# Patient Record
Sex: Male | Born: 1940 | Race: Black or African American | Hispanic: No | Smoking: Former smoker
Health system: Southern US, Community
[De-identification: ages and names within clinical notes are randomized; demographics above are authoritative.]

## PROBLEM LIST (undated history)

## (undated) VITALS — BP 138/62 | HR 84 | Temp 98.7°F | Resp 17 | Ht 72.99 in | Wt 148.5 lb

## (undated) DIAGNOSIS — L8994 Pressure ulcer of unspecified site, stage 4: Secondary | ICD-10-CM

## (undated) DIAGNOSIS — I10 Essential (primary) hypertension: Secondary | ICD-10-CM

## (undated) DIAGNOSIS — L089 Local infection of the skin and subcutaneous tissue, unspecified: Secondary | ICD-10-CM

## (undated) DIAGNOSIS — O2230 Deep phlebothrombosis in pregnancy, unspecified trimester: Secondary | ICD-10-CM

## (undated) DIAGNOSIS — N400 Enlarged prostate without lower urinary tract symptoms: Secondary | ICD-10-CM

## (undated) DIAGNOSIS — G8250 Quadriplegia, unspecified: Secondary | ICD-10-CM

## (undated) DIAGNOSIS — I639 Cerebral infarction, unspecified: Secondary | ICD-10-CM

## (undated) DIAGNOSIS — D649 Anemia, unspecified: Secondary | ICD-10-CM

## (undated) DIAGNOSIS — J690 Pneumonitis due to inhalation of food and vomit: Secondary | ICD-10-CM

## (undated) DIAGNOSIS — E119 Type 2 diabetes mellitus without complications: Secondary | ICD-10-CM

## (undated) HISTORY — PX: COLON SURGERY: SHX602

## (undated) HISTORY — PX: COLOSTOMY: SHX63

## (undated) HISTORY — PX: SUPRAPUBIC CATHETER INSERTION: SUR719

## (undated) HISTORY — PX: PEG PLACEMENT: SHX5437

---

## 2014-08-20 ENCOUNTER — Inpatient Hospital Stay
Admit: 2014-08-20 | Discharge: 2014-09-13 | DRG: 853 | Disposition: A | Discharge status: Skilled Nursing Facility | Payer: Medicare PPO | Source: Ambulatory Visit | Attending: Internal Medicine | Admitting: Internal Medicine

## 2014-08-20 DIAGNOSIS — D62 Acute posthemorrhagic anemia: Secondary | ICD-10-CM | POA: Diagnosis not present

## 2014-08-20 DIAGNOSIS — M62838 Other muscle spasm: Secondary | ICD-10-CM | POA: Diagnosis not present

## 2014-08-20 DIAGNOSIS — L89154 Pressure ulcer of sacral region, stage 4: Secondary | ICD-10-CM | POA: Diagnosis present

## 2014-08-20 DIAGNOSIS — K2960 Other gastritis without bleeding: Secondary | ICD-10-CM | POA: Diagnosis present

## 2014-08-20 DIAGNOSIS — N3000 Acute cystitis without hematuria: Secondary | ICD-10-CM | POA: Diagnosis present

## 2014-08-20 DIAGNOSIS — N390 Urinary tract infection, site not specified: Secondary | ICD-10-CM

## 2014-08-20 DIAGNOSIS — I82A12 Acute embolism and thrombosis of left axillary vein: Secondary | ICD-10-CM | POA: Diagnosis present

## 2014-08-20 DIAGNOSIS — K2980 Duodenitis without bleeding: Secondary | ICD-10-CM | POA: Diagnosis present

## 2014-08-20 DIAGNOSIS — K5900 Constipation, unspecified: Secondary | ICD-10-CM | POA: Diagnosis present

## 2014-08-20 DIAGNOSIS — R1314 Dysphagia, pharyngoesophageal phase: Secondary | ICD-10-CM | POA: Diagnosis present

## 2014-08-20 DIAGNOSIS — B961 Klebsiella pneumoniae [K. pneumoniae] as the cause of diseases classified elsewhere: Secondary | ICD-10-CM | POA: Diagnosis present

## 2014-08-20 DIAGNOSIS — R633 Feeding difficulties: Secondary | ICD-10-CM | POA: Diagnosis present

## 2014-08-20 DIAGNOSIS — D649 Anemia, unspecified: Secondary | ICD-10-CM | POA: Diagnosis present

## 2014-08-20 DIAGNOSIS — J690 Pneumonitis due to inhalation of food and vomit: Secondary | ICD-10-CM | POA: Diagnosis present

## 2014-08-20 DIAGNOSIS — L089 Local infection of the skin and subcutaneous tissue, unspecified: Secondary | ICD-10-CM

## 2014-08-20 DIAGNOSIS — R6520 Severe sepsis without septic shock: Secondary | ICD-10-CM | POA: Diagnosis present

## 2014-08-20 DIAGNOSIS — T8351XA Infection and inflammatory reaction due to indwelling urinary catheter, initial encounter: Secondary | ICD-10-CM | POA: Diagnosis present

## 2014-08-20 DIAGNOSIS — G253 Myoclonus: Secondary | ICD-10-CM | POA: Diagnosis present

## 2014-08-20 DIAGNOSIS — G9341 Metabolic encephalopathy: Secondary | ICD-10-CM | POA: Diagnosis present

## 2014-08-20 DIAGNOSIS — A419 Sepsis, unspecified organism: Secondary | ICD-10-CM

## 2014-08-20 DIAGNOSIS — L89159 Pressure ulcer of sacral region, unspecified stage: Secondary | ICD-10-CM | POA: Diagnosis not present

## 2014-08-20 DIAGNOSIS — E43 Unspecified severe protein-calorie malnutrition: Secondary | ICD-10-CM | POA: Diagnosis present

## 2014-08-20 DIAGNOSIS — N179 Acute kidney failure, unspecified: Secondary | ICD-10-CM

## 2014-08-20 DIAGNOSIS — E870 Hyperosmolality and hypernatremia: Secondary | ICD-10-CM | POA: Diagnosis present

## 2014-08-20 DIAGNOSIS — Z933 Colostomy status: Secondary | ICD-10-CM

## 2014-08-20 DIAGNOSIS — Z452 Encounter for adjustment and management of vascular access device: Secondary | ICD-10-CM

## 2014-08-20 DIAGNOSIS — Z79899 Other long term (current) drug therapy: Secondary | ICD-10-CM

## 2014-08-20 DIAGNOSIS — A4189 Other specified sepsis: Secondary | ICD-10-CM | POA: Diagnosis present

## 2014-08-20 DIAGNOSIS — F329 Major depressive disorder, single episode, unspecified: Secondary | ICD-10-CM | POA: Diagnosis present

## 2014-08-20 DIAGNOSIS — M7989 Other specified soft tissue disorders: Secondary | ICD-10-CM

## 2014-08-20 DIAGNOSIS — Z681 Body mass index (BMI) 19 or less, adult: Secondary | ICD-10-CM

## 2014-08-20 DIAGNOSIS — G8250 Quadriplegia, unspecified: Secondary | ICD-10-CM | POA: Diagnosis present

## 2014-08-20 DIAGNOSIS — E119 Type 2 diabetes mellitus without complications: Secondary | ICD-10-CM | POA: Diagnosis present

## 2014-08-20 DIAGNOSIS — R4182 Altered mental status, unspecified: Secondary | ICD-10-CM | POA: Insufficient documentation

## 2014-08-20 DIAGNOSIS — N170 Acute kidney failure with tubular necrosis: Secondary | ICD-10-CM | POA: Diagnosis not present

## 2014-08-20 DIAGNOSIS — I10 Essential (primary) hypertension: Secondary | ICD-10-CM | POA: Diagnosis present

## 2014-08-20 DIAGNOSIS — L8990 Pressure ulcer of unspecified site, unspecified stage: Secondary | ICD-10-CM

## 2014-08-20 DIAGNOSIS — R609 Edema, unspecified: Secondary | ICD-10-CM

## 2014-08-20 DIAGNOSIS — R627 Adult failure to thrive: Secondary | ICD-10-CM | POA: Diagnosis not present

## 2014-08-20 DIAGNOSIS — B957 Other staphylococcus as the cause of diseases classified elsewhere: Secondary | ICD-10-CM | POA: Diagnosis present

## 2014-08-20 DIAGNOSIS — R531 Weakness: Secondary | ICD-10-CM

## 2014-08-20 DIAGNOSIS — Z9359 Other cystostomy status: Secondary | ICD-10-CM

## 2014-08-20 LAB — TROPONIN I: Troponin-I: 0.03 ng/mL

## 2014-08-20 LAB — COMPREHENSIVE METABOLIC PANEL
ALBUMIN: 2 g/dL — AB
ALK PHOS: 129 U/L — AB
ALT: 19 U/L
ANION GAP: 11 (ref 7–16)
BUN: 53 mg/dL — ABNORMAL HIGH
Bilirubin,Total: 0.7 mg/dL
CALCIUM: 8.5 mg/dL — AB
CREATININE: 2.25 mg/dL — AB
Chloride: 104 mmol/L
Co2: 28 mmol/L
EGFR (Non-African Amer.): 27 — ABNORMAL LOW
GFR CALC AF AMER: 32 — AB
Glucose: 157 mg/dL — ABNORMAL HIGH
Potassium: 4.1 mmol/L
SGOT(AST): 41 U/L
Sodium: 143 mmol/L
Total Protein: 6.3 g/dL — ABNORMAL LOW

## 2014-08-20 LAB — URINALYSIS, COMPLETE
Bilirubin,UR: NEGATIVE
Glucose,UR: NEGATIVE mg/dL (ref 0–75)
Ketone: NEGATIVE
NITRITE: NEGATIVE
PROTEIN: NEGATIVE
Ph: 5 (ref 4.5–8.0)
Specific Gravity: 1.016 (ref 1.003–1.030)
Squamous Epithelial: NONE SEEN

## 2014-08-20 LAB — CBC
HCT: 24.3 % — AB (ref 40.0–52.0)
HGB: 7.7 g/dL — ABNORMAL LOW (ref 13.0–18.0)
MCH: 29.1 pg (ref 26.0–34.0)
MCHC: 31.8 g/dL — AB (ref 32.0–36.0)
MCV: 91 fL (ref 80–100)
PLATELETS: 287 10*3/uL (ref 150–440)
RBC: 2.66 10*6/uL — ABNORMAL LOW (ref 4.40–5.90)
RDW: 15.6 % — ABNORMAL HIGH (ref 11.5–14.5)
WBC: 6.9 10*3/uL (ref 3.8–10.6)

## 2014-08-20 LAB — LACTIC ACID, PLASMA: Lactic Acid, Venous: 2 mmol/L

## 2014-08-21 LAB — CBC WITH DIFFERENTIAL/PLATELET
Basophil #: 0 10*3/uL (ref 0.0–0.1)
Basophil %: 0.2 %
EOS PCT: 0.7 %
Eosinophil #: 0 10*3/uL (ref 0.0–0.7)
HCT: 20.6 % — AB (ref 40.0–52.0)
HGB: 6.5 g/dL — ABNORMAL LOW (ref 13.0–18.0)
LYMPHS PCT: 10.2 %
Lymphocyte #: 0.6 10*3/uL — ABNORMAL LOW (ref 1.0–3.6)
MCH: 28.7 pg (ref 26.0–34.0)
MCHC: 31.6 g/dL — AB (ref 32.0–36.0)
MCV: 91 fL (ref 80–100)
MONO ABS: 0.6 x10 3/mm (ref 0.2–1.0)
Monocyte %: 10.2 %
NEUTROS PCT: 78.7 %
Neutrophil #: 4.3 10*3/uL (ref 1.4–6.5)
PLATELETS: 227 10*3/uL (ref 150–440)
RBC: 2.27 10*6/uL — ABNORMAL LOW (ref 4.40–5.90)
RDW: 15.5 % — AB (ref 11.5–14.5)
WBC: 5.4 10*3/uL (ref 3.8–10.6)

## 2014-08-21 LAB — BASIC METABOLIC PANEL
Anion Gap: 6 — ABNORMAL LOW (ref 7–16)
BUN: 45 mg/dL — ABNORMAL HIGH
CALCIUM: 7.7 mg/dL — AB
CREATININE: 1.58 mg/dL — AB
Chloride: 113 mmol/L — ABNORMAL HIGH
Co2: 26 mmol/L
EGFR (African American): 49 — ABNORMAL LOW
EGFR (Non-African Amer.): 42 — ABNORMAL LOW
Glucose: 86 mg/dL
POTASSIUM: 3.8 mmol/L
Sodium: 145 mmol/L

## 2014-08-21 LAB — OCCULT BLOOD X 1 CARD TO LAB, STOOL: Occult Blood, Feces: POSITIVE

## 2014-08-22 LAB — BASIC METABOLIC PANEL
ANION GAP: 9 (ref 7–16)
BUN: 33 mg/dL — ABNORMAL HIGH
Calcium, Total: 7.8 mg/dL — ABNORMAL LOW
Chloride: 113 mmol/L — ABNORMAL HIGH
Co2: 25 mmol/L
Creatinine: 1.19 mg/dL
EGFR (African American): >60
EGFR (Non-African Amer.): 59 — ABNORMAL LOW
Glucose: 91 mg/dL
Potassium: 4.1 mmol/L
Sodium: 147 mmol/L — ABNORMAL HIGH

## 2014-08-22 LAB — CBC WITH DIFFERENTIAL/PLATELET
Basophil #: 0 10*3/uL
Basophil %: 0.4 %
Eosinophil #: 0.1 10*3/uL
Eosinophil %: 3 %
HCT: 25.3 % — ABNORMAL LOW
HGB: 7.9 g/dL — ABNORMAL LOW
Lymphocyte %: 16.8 %
Lymphs Abs: 0.8 10*3/uL — ABNORMAL LOW
MCH: 27.9 pg
MCHC: 31.2 g/dL — ABNORMAL LOW
MCV: 90 fL
Monocyte #: 0.4 10*3/uL
Monocyte %: 9.4 %
Neutrophil #: 3.2 10*3/uL
Neutrophil %: 70.4 %
Platelet: 250 10*3/uL
RBC: 2.82 x10 6/mm 3 — ABNORMAL LOW
RDW: 16.2 % — ABNORMAL HIGH
WBC: 4.6 10*3/uL

## 2014-08-23 LAB — BASIC METABOLIC PANEL
ANION GAP: 8 (ref 7–16)
BUN: 28 mg/dL — AB
Calcium, Total: 7.9 mg/dL — ABNORMAL LOW
Chloride: 115 mmol/L — ABNORMAL HIGH
Co2: 23 mmol/L
Creatinine: 1.03 mg/dL
EGFR (African American): >60
Glucose: 203 mg/dL — ABNORMAL HIGH
Potassium: 3.9 mmol/L
Sodium: 146 mmol/L — ABNORMAL HIGH

## 2014-08-23 LAB — HEMOGLOBIN A1C: HEMOGLOBIN A1C: 7.5 % — AB

## 2014-08-23 LAB — URINE CULTURE

## 2014-08-23 LAB — HEMOGLOBIN: HGB: 7.8 g/dL — ABNORMAL LOW (ref 13.0–18.0)

## 2014-08-23 LAB — VANCOMYCIN, TROUGH: Vancomycin, Trough: 16 ug/mL

## 2014-08-24 LAB — CBC WITH DIFFERENTIAL/PLATELET
Basophil #: 0 10*3/uL (ref 0.0–0.1)
Basophil %: 0.3 %
EOS PCT: 1 %
Eosinophil #: 0.1 10*3/uL (ref 0.0–0.7)
HCT: 24.3 % — AB (ref 40.0–52.0)
HGB: 7.6 g/dL — AB (ref 13.0–18.0)
LYMPHS ABS: 0.9 10*3/uL — AB (ref 1.0–3.6)
Lymphocyte %: 17.6 %
MCH: 28 pg (ref 26.0–34.0)
MCHC: 31.1 g/dL — ABNORMAL LOW (ref 32.0–36.0)
MCV: 90 fL (ref 80–100)
MONOS PCT: 10.5 %
Monocyte #: 0.5 x10 3/mm (ref 0.2–1.0)
NEUTROS ABS: 3.7 10*3/uL (ref 1.4–6.5)
Neutrophil %: 70.6 %
Platelet: 266 10*3/uL (ref 150–440)
RBC: 2.7 10*6/uL — AB (ref 4.40–5.90)
RDW: 16.4 % — ABNORMAL HIGH (ref 11.5–14.5)
WBC: 5.2 10*3/uL (ref 3.8–10.6)

## 2014-08-24 LAB — BASIC METABOLIC PANEL
ANION GAP: 6 — AB (ref 7–16)
BUN: 24 mg/dL — AB
CALCIUM: 7.9 mg/dL — AB
Chloride: 113 mmol/L — ABNORMAL HIGH
Co2: 25 mmol/L
Creatinine: 1.14 mg/dL
EGFR (African American): >60
EGFR (Non-African Amer.): >60
GLUCOSE: 156 mg/dL — AB
Potassium: 3.8 mmol/L
Sodium: 144 mmol/L

## 2014-08-25 LAB — CBC WITH DIFFERENTIAL/PLATELET
BASOS ABS: 0 10*3/uL (ref 0.0–0.1)
Basophil %: 0.3 %
Eosinophil #: 0.1 10*3/uL (ref 0.0–0.7)
Eosinophil %: 2 %
HCT: 24.3 % — AB (ref 40.0–52.0)
HGB: 7.6 g/dL — ABNORMAL LOW (ref 13.0–18.0)
LYMPHS ABS: 0.7 10*3/uL — AB (ref 1.0–3.6)
Lymphocyte %: 11.6 %
MCH: 28.2 pg (ref 26.0–34.0)
MCHC: 31.2 g/dL — ABNORMAL LOW (ref 32.0–36.0)
MCV: 90 fL (ref 80–100)
MONO ABS: 0.5 x10 3/mm (ref 0.2–1.0)
Monocyte %: 8.3 %
NEUTROS ABS: 4.6 10*3/uL (ref 1.4–6.5)
Neutrophil %: 77.8 %
Platelet: 283 10*3/uL (ref 150–440)
RBC: 2.69 10*6/uL — ABNORMAL LOW (ref 4.40–5.90)
RDW: 16.2 % — ABNORMAL HIGH (ref 11.5–14.5)
WBC: 5.9 10*3/uL (ref 3.8–10.6)

## 2014-08-25 LAB — BASIC METABOLIC PANEL
Anion Gap: 9 (ref 7–16)
BUN: 27 mg/dL — AB
CHLORIDE: 111 mmol/L
CREATININE: 1.18 mg/dL
Calcium, Total: 7.8 mg/dL — ABNORMAL LOW
Co2: 24 mmol/L
EGFR (Non-African Amer.): 60 — ABNORMAL LOW
Glucose: 145 mg/dL — ABNORMAL HIGH
Potassium: 4.2 mmol/L
Sodium: 144 mmol/L

## 2014-08-25 LAB — CULTURE, BLOOD (SINGLE)

## 2014-08-25 LAB — WOUND CULTURE

## 2014-08-26 LAB — BASIC METABOLIC PANEL
ANION GAP: 7 (ref 7–16)
BUN: 23 mg/dL — AB
CREATININE: 0.98 mg/dL
Calcium, Total: 7.6 mg/dL — ABNORMAL LOW
Chloride: 114 mmol/L — ABNORMAL HIGH
Co2: 26 mmol/L
EGFR (Non-African Amer.): >60
Glucose: 153 mg/dL — ABNORMAL HIGH
POTASSIUM: 4 mmol/L
Sodium: 147 mmol/L — ABNORMAL HIGH

## 2014-08-26 LAB — CBC WITH DIFFERENTIAL/PLATELET
BASOS PCT: 0.4 %
Basophil #: 0 10*3/uL (ref 0.0–0.1)
EOS ABS: 0.1 10*3/uL (ref 0.0–0.7)
EOS PCT: 2 %
HCT: 21.8 % — ABNORMAL LOW (ref 40.0–52.0)
HGB: 6.7 g/dL — AB (ref 13.0–18.0)
Lymphocyte #: 0.7 10*3/uL — ABNORMAL LOW (ref 1.0–3.6)
Lymphocyte %: 15.2 %
MCH: 28 pg (ref 26.0–34.0)
MCHC: 30.8 g/dL — ABNORMAL LOW (ref 32.0–36.0)
MCV: 91 fL (ref 80–100)
Monocyte #: 0.4 x10 3/mm (ref 0.2–1.0)
Monocyte %: 9.2 %
NEUTROS PCT: 73.2 %
Neutrophil #: 3.2 10*3/uL (ref 1.4–6.5)
Platelet: 231 10*3/uL (ref 150–440)
RBC: 2.4 10*6/uL — AB (ref 4.40–5.90)
RDW: 16.7 % — ABNORMAL HIGH (ref 11.5–14.5)
WBC: 4.4 10*3/uL (ref 3.8–10.6)

## 2014-08-26 LAB — HEMOGLOBIN: HGB: 8.3 g/dL — ABNORMAL LOW (ref 13.0–18.0)

## 2014-08-26 LAB — HEMATOCRIT: HCT: 26.1 % — AB (ref 40.0–52.0)

## 2014-08-26 LAB — SEDIMENTATION RATE: Erythrocyte Sed Rate: 91 mm/hr — ABNORMAL HIGH (ref 0–20)

## 2014-08-27 DIAGNOSIS — G8250 Quadriplegia, unspecified: Secondary | ICD-10-CM | POA: Diagnosis present

## 2014-08-27 LAB — BASIC METABOLIC PANEL
Anion Gap: 5 — ABNORMAL LOW (ref 7–16)
BUN: 18 mg/dL
CHLORIDE: 115 mmol/L — AB
CO2: 26 mmol/L
CREATININE: 0.82 mg/dL
Calcium, Total: 7.6 mg/dL — ABNORMAL LOW
EGFR (African American): >60
EGFR (Non-African Amer.): >60
GLUCOSE: 179 mg/dL — AB
Potassium: 3.8 mmol/L
Sodium: 146 mmol/L — ABNORMAL HIGH

## 2014-08-27 LAB — CBC WITH DIFFERENTIAL/PLATELET
Basophil #: 0 10*3/uL (ref 0.0–0.1)
Basophil %: 0.4 %
Eosinophil #: 0.1 10*3/uL (ref 0.0–0.7)
Eosinophil %: 1.4 %
HCT: 23.6 % — ABNORMAL LOW (ref 40.0–52.0)
HGB: 7.7 g/dL — ABNORMAL LOW (ref 13.0–18.0)
Lymphocyte #: 0.9 10*3/uL — ABNORMAL LOW (ref 1.0–3.6)
Lymphocyte %: 15.4 %
MCH: 29.4 pg (ref 26.0–34.0)
MCHC: 32.6 g/dL (ref 32.0–36.0)
MCV: 90 fL (ref 80–100)
MONOS PCT: 7.9 %
Monocyte #: 0.5 x10 3/mm (ref 0.2–1.0)
NEUTROS PCT: 74.9 %
Neutrophil #: 4.6 10*3/uL (ref 1.4–6.5)
PLATELETS: 231 10*3/uL (ref 150–440)
RBC: 2.61 10*6/uL — AB (ref 4.40–5.90)
RDW: 16.6 % — ABNORMAL HIGH (ref 11.5–14.5)
WBC: 6.1 10*3/uL (ref 3.8–10.6)

## 2014-08-27 MED ORDER — HEPARIN SODIUM (PORCINE) 5000 UNIT/ML IJ SOLN
5000.0000 [IU] | Freq: Three times a day (TID) | INTRAMUSCULAR | Status: DC
  Administered 2014-08-28 – 2014-09-06 (×22): 5000 [IU] via SUBCUTANEOUS
  Filled 2014-08-27 (×25): qty 1

## 2014-08-27 MED ORDER — KCL IN DEXTROSE-NACL 20-5-0.45 MEQ/L-%-% IV SOLN
INTRAVENOUS | Status: DC
  Administered 2014-08-28 – 2014-08-29 (×5): via INTRAVENOUS
  Filled 2014-08-27 (×10): qty 1000

## 2014-08-27 MED ORDER — BACLOFEN 10 MG PO TABS
5.0000 mg | ORAL_TABLET | Freq: Two times a day (BID) | ORAL | Status: DC
  Administered 2014-08-28 (×2): 5 mg via ORAL
  Administered 2014-08-29: 10 mg via ORAL
  Administered 2014-08-29 – 2014-08-31 (×5): 5 mg via ORAL
  Administered 2014-09-02: 10 mg via ORAL
  Administered 2014-09-02 – 2014-09-04 (×4): 5 mg via ORAL
  Filled 2014-08-27 (×13): qty 1

## 2014-08-27 MED ORDER — SODIUM CHLORIDE 0.9 % IJ SOLN
5.0000 mL | INTRAMUSCULAR | Status: DC | PRN
  Administered 2014-08-30: 10 mL via INTRAVENOUS
  Filled 2014-08-27: qty 10

## 2014-08-27 MED ORDER — ONDANSETRON HCL 4 MG/2ML IJ SOLN
4.0000 mg | Freq: Four times a day (QID) | INTRAMUSCULAR | Status: DC | PRN
  Filled 2014-08-27: qty 2

## 2014-08-27 MED ORDER — SODIUM CHLORIDE 0.9 % IJ SOLN: 10.0000 mL | INTRAMUSCULAR | Status: DC | PRN

## 2014-08-27 MED ORDER — SODIUM CHLORIDE 0.9 % IV SOLN
390.0000 mg | INTRAVENOUS | Status: DC
  Administered 2014-08-28 – 2014-09-13 (×17): 390 mg via INTRAVENOUS
  Filled 2014-08-27 (×17): qty 7.8

## 2014-08-27 MED ORDER — SODIUM CHLORIDE 0.9 % IJ SOLN
10.0000 mL | Freq: Two times a day (BID) | INTRAMUSCULAR | Status: DC
Start: 2014-08-28 — End: 2014-08-27

## 2014-08-27 MED ORDER — COLLAGENASE 250 UNIT/GM EX OINT
TOPICAL_OINTMENT | Freq: Every day | CUTANEOUS | Status: AC
  Administered 2014-08-28 – 2014-08-31 (×4): via TOPICAL
  Administered 2014-09-02: 1 via TOPICAL
  Administered 2014-09-03 – 2014-09-10 (×6): via TOPICAL
  Administered 2014-09-11: 1 via TOPICAL
  Filled 2014-08-27 (×2): qty 30

## 2014-08-27 MED ORDER — METOPROLOL TARTRATE 25 MG PO TABS
25.0000 mg | ORAL_TABLET | Freq: Two times a day (BID) | ORAL | Status: DC
  Administered 2014-08-28 – 2014-09-13 (×25): 25 mg via ORAL
  Filled 2014-08-27 (×27): qty 1

## 2014-08-27 MED ORDER — INSULIN ASPART 100 UNIT/ML ~~LOC~~ SOLN
0.0000 [IU] | Freq: Three times a day (TID) | SUBCUTANEOUS | Status: DC
  Administered 2014-08-28: 12 [IU] via SUBCUTANEOUS
  Administered 2014-08-28 (×2): 4 [IU] via SUBCUTANEOUS
  Administered 2014-08-29 (×2): 8 [IU] via SUBCUTANEOUS
  Administered 2014-08-29: 4 [IU] via SUBCUTANEOUS
  Administered 2014-08-29: 100 [IU] via SUBCUTANEOUS
  Administered 2014-08-30: 4 [IU] via SUBCUTANEOUS
  Administered 2014-08-30: 8 [IU] via SUBCUTANEOUS
  Administered 2014-08-31 – 2014-09-03 (×8): 4 [IU] via SUBCUTANEOUS
  Administered 2014-09-03 (×2): 8 [IU] via SUBCUTANEOUS
  Administered 2014-09-04: 4 [IU] via SUBCUTANEOUS
  Administered 2014-09-04: 8 [IU] via SUBCUTANEOUS
  Administered 2014-09-04 – 2014-09-06 (×3): 4 [IU] via SUBCUTANEOUS
  Administered 2014-09-06: 8 [IU] via SUBCUTANEOUS
  Administered 2014-09-07 (×2): 4 [IU] via SUBCUTANEOUS
  Administered 2014-09-08: 8 [IU] via SUBCUTANEOUS
  Administered 2014-09-08 – 2014-09-10 (×2): 4 [IU] via SUBCUTANEOUS
  Administered 2014-09-11: 8 [IU] via SUBCUTANEOUS
  Administered 2014-09-11 – 2014-09-12 (×5): 4 [IU] via SUBCUTANEOUS
  Administered 2014-09-12 – 2014-09-13 (×2): 8 [IU] via SUBCUTANEOUS
  Administered 2014-09-13: 4 [IU] via SUBCUTANEOUS
  Administered 2014-09-13: 8 [IU] via SUBCUTANEOUS
  Filled 2014-08-27: qty 2
  Filled 2014-08-27 (×2): qty 4
  Filled 2014-08-27: qty 8
  Filled 2014-08-27: qty 4
  Filled 2014-08-27: qty 8
  Filled 2014-08-27 (×3): qty 4
  Filled 2014-08-27: qty 8
  Filled 2014-08-27 (×2): qty 4
  Filled 2014-08-27: qty 0.1
  Filled 2014-08-27: qty 4
  Filled 2014-08-27: qty 8
  Filled 2014-08-27 (×3): qty 4
  Filled 2014-08-27: qty 8
  Filled 2014-08-27 (×3): qty 4
  Filled 2014-08-27 (×2): qty 8
  Filled 2014-08-27: qty 4
  Filled 2014-08-27 (×2): qty 8
  Filled 2014-08-27 (×2): qty 4
  Filled 2014-08-27 (×4): qty 8
  Filled 2014-08-27 (×3): qty 4
  Filled 2014-08-27: qty 8
  Filled 2014-08-27 (×3): qty 4
  Filled 2014-08-27: qty 8
  Filled 2014-08-27 (×2): qty 4
  Filled 2014-08-27: qty 8

## 2014-08-27 MED ORDER — ACETAMINOPHEN 325 MG PO TABS
650.0000 mg | ORAL_TABLET | ORAL | Status: DC | PRN
  Administered 2014-09-04 (×2): 650 mg via ORAL
  Filled 2014-08-27: qty 2

## 2014-08-27 MED ORDER — METRONIDAZOLE 500 MG PO TABS
500.0000 mg | ORAL_TABLET | Freq: Three times a day (TID) | ORAL | Status: DC
  Administered 2014-08-28 – 2014-09-01 (×12): 500 mg via ORAL
  Filled 2014-08-27 (×12): qty 1

## 2014-08-27 MED ORDER — SODIUM CHLORIDE 0.9 % IJ SOLN
5.0000 mL | Freq: Two times a day (BID) | INTRAMUSCULAR | Status: DC
  Administered 2014-08-28 – 2014-08-29 (×3): 5 mL via INTRAVENOUS
  Administered 2014-08-29: 3 mL via INTRAVENOUS
  Administered 2014-08-29: 10 mL via INTRAVENOUS
  Administered 2014-08-30 – 2014-08-31 (×3): 5 mL via INTRAVENOUS
  Filled 2014-08-27: qty 10

## 2014-08-27 MED ORDER — SODIUM CHLORIDE 0.9 % IJ SOLN: 3.0000 mL | INTRAMUSCULAR | Status: DC | PRN

## 2014-08-27 MED ORDER — ACETAMINOPHEN 650 MG RE SUPP
650.0000 mg | RECTAL | Status: DC | PRN
  Administered 2014-09-04: 650 mg via RECTAL
  Filled 2014-08-27: qty 1

## 2014-08-27 MED ORDER — SODIUM CHLORIDE 0.9 % IJ SOLN
3.0000 mL | INTRAMUSCULAR | Status: DC | PRN
Start: 2014-08-28 — End: 2014-09-14
  Filled 2014-08-27: qty 10

## 2014-08-27 MED ORDER — CIPROFLOXACIN HCL 500 MG PO TABS
500.0000 mg | ORAL_TABLET | Freq: Two times a day (BID) | ORAL | Status: DC
  Administered 2014-08-28 – 2014-08-31 (×8): 500 mg via ORAL
  Filled 2014-08-27 (×11): qty 1

## 2014-08-28 LAB — GLUCOSE, CAPILLARY
GLUCOSE-CAPILLARY: 251 mg/dL — AB (ref 70–99)
Glucose-Capillary: 198 mg/dL — ABNORMAL HIGH (ref 70–99)
Glucose-Capillary: 195 mg/dL — ABNORMAL HIGH (ref 70–99)
Glucose-Capillary: 124 mg/dL — ABNORMAL HIGH (ref 70–99)

## 2014-08-28 LAB — ABO/RH: ABO/RH(D): B POS

## 2014-08-28 LAB — HEMOGLOBIN AND HEMATOCRIT, BLOOD
HCT: 22.7 % — ABNORMAL LOW (ref 40.0–52.0)
HCT: 26.1 % — ABNORMAL LOW (ref 40.0–52.0)
HEMOGLOBIN: 7.3 g/dL — AB (ref 13.0–18.0)
Hemoglobin: 8.5 g/dL — ABNORMAL LOW (ref 13.0–18.0)

## 2014-08-28 LAB — SODIUM: Sodium: 145 mmol/L (ref 135–145)

## 2014-08-28 MED ORDER — ACETAMINOPHEN 325 MG PO TABS
650.0000 mg | ORAL_TABLET | Freq: Once | ORAL | Status: AC
  Administered 2014-08-28: 650 mg via ORAL
  Filled 2014-08-28: qty 2

## 2014-08-28 MED ORDER — SODIUM CHLORIDE 0.9 % IV SOLN
Freq: Once | INTRAVENOUS | Status: AC
  Administered 2014-08-28: 15:00:00 via INTRAVENOUS

## 2014-08-28 MED ORDER — DIPHENHYDRAMINE HCL 25 MG PO CAPS
25.0000 mg | ORAL_CAPSULE | Freq: Once | ORAL | Status: AC
  Administered 2014-08-28: 25 mg via ORAL
  Filled 2014-08-28: qty 1
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
## 2014-08-28 NOTE — Op Note (Addendum)
PATIENT NAME:  Sean Morgan, Claudis MR#:  [id number] DATE OF BIRTH:  08-30-1940  DATE OF PROCEDURE:  08/24/2014.  PREOPERATIVE DIAGNOSES:  Sacral decubitus, bladder outlet obstruction.   POSTOPERATIVE DIAGNOSES:  Sacral decubitus, bladder outlet obstruction.   PROCEDURE PERFORMED:   1. Debridement and wound vacuum-assisted closure placement, sacral decubitus.  2. Transverse double-barrel colostomy. 3. Suprapubic catheter placement.   ANESTHESIA: General.   SURGEON:  Carmie Endalph L. Ely III, MD.   Threasa HeadsASSISTANDebby Freiberg:  Klause, PA student.   OPERATIVE PROCEDURE:  With the patient intubated, the patient was turned into the prone position, appropriately padded in position.  His peroneal and sacral area were prepped with Betadine and draped with sterile towels.  Necrotic tissue was debrided without difficulty.  A wound VAC dressing was placed.  The patient was then turned back to the supine position.  The patient's abdomen was prepped with ChloraPrep and draped with sterile towels.  An Ioban drape was utilized.  An incision was made just above the umbilicus, just below the umbilicus, and carried down through the subcutaneous tissue with Bovie electrocautery.  No significant abnormalities were identified in the abdomen with exception of a massively distended bladder.  Bladder was nearly the size of a basketball.  It was distended above the umbilicus.  The ostomy was addressed first.  The transverse colon was elevated and the incision mesentery divided, with the middle colic artery to the proximal side, down in a V fashion.  Skin sites were chosen in both the left and the right upper quadrants.  The skin incisions made, cruciate incision in the fascia, and an ostomy site created on both sides.  The bowel was brought through the abdominal wall on both sides.  The peritoneum and posterior fascia were then sutured to the seromuscular layer with 3-0 silk.  Because of the massively distended bladder felt to be due to be bladder  outlet obstruction, a supraperitoneal plane was developed down to the pubic bone.  A separate stab wound was created and the area cannulated with a 24-French Silastic Foley catheter.  A small rent was made in the bladder with Bovie and the catheter inserted without difficulty.  The catheter was then sutured in the bladder using 0 Vicryl.  The bladder decompressed rapidly.  The abdomen was then irrigated and closed in a single layer using interrupted figure-of-eight sutures of 0 Maxon.  A drain was placed using a Penrose in the bed of the incision and the skin clipped.  The ostomy was then matured using 3 and 4-0 Vicryl suture.  Ostomy appliances were placed on both sides. Sterile dressings were placed, and the patient was returned to the recovery room, having tolerated the procedure well.  Sponge, instrument, and needle counts were correct x 2 in the operating room.     ____________________________ Carmie Endalph L. Ely III, MD rle:kc D: 08/24/2014 15:32:09 ET T: 08/24/2014 19:44:45 ET JOB#: [id number]  cc: Quentin Orealph L. Ely III, MD, <Dictator> Quentin OreALPH L ELY MD ELECTRONICALLY SIGNED 09/12/2014 14:30
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
## 2014-08-29 LAB — BASIC METABOLIC PANEL
Anion gap: 6 (ref 5–15)
BUN: 12 mg/dL (ref 6–20)
CALCIUM: 7.8 mg/dL — AB (ref 8.9–10.3)
CO2: 25 mmol/L (ref 22–32)
Chloride: 114 mmol/L — ABNORMAL HIGH (ref 101–111)
Creatinine, Ser: 0.67 mg/dL (ref 0.61–1.24)
GFR calc Af Amer: >60 mL/min (ref >60)
Glucose, Bld: 178 mg/dL — ABNORMAL HIGH (ref 65–99)
Potassium: 4.2 mmol/L (ref 3.5–5.1)
Sodium: 145 mmol/L (ref 135–145)

## 2014-08-29 LAB — HEMOGLOBIN: Hemoglobin: 9.3 g/dL — ABNORMAL LOW (ref 13.0–18.0)

## 2014-08-29 LAB — TYPE AND SCREEN
ABO/RH(D): B POS
Antibody Screen: NEGATIVE
Unit division: 0

## 2014-08-29 LAB — GLUCOSE, CAPILLARY
GLUCOSE-CAPILLARY: 248 mg/dL — AB (ref 70–99)
Glucose-Capillary: 124 mg/dL — ABNORMAL HIGH (ref 70–99)
Glucose-Capillary: 190 mg/dL — ABNORMAL HIGH (ref 70–99)
Glucose-Capillary: 206 mg/dL — ABNORMAL HIGH (ref 70–99)
Glucose-Capillary: 211 mg/dL — ABNORMAL HIGH (ref 70–99)

## 2014-08-29 LAB — CK: Total CK: 64 U/L (ref 49–397)

## 2014-08-29 MED ORDER — FUROSEMIDE 20 MG PO TABS
20.0000 mg | ORAL_TABLET | Freq: Two times a day (BID) | ORAL | Status: DC
  Administered 2014-08-29 – 2014-08-31 (×5): 20 mg via ORAL
  Filled 2014-08-29 (×5): qty 1

## 2014-08-29 MED ORDER — POTASSIUM CHLORIDE CRYS ER 20 MEQ PO TBCR
20.0000 meq | EXTENDED_RELEASE_TABLET | Freq: Every day | ORAL | Status: DC
  Administered 2014-08-29: 20 meq via ORAL
  Filled 2014-08-29 (×2): qty 1

## 2014-08-29 NOTE — Consult Note (Addendum)
WOC wound consult note Reason for Consult:Stage IV sacral pressure ulcer, present on admission.  Surgical debridement performed 08/26/14.  Wound type: Stage IV pressure ulcer Pressure Ulcer POA: Yes Measurement: 9 cm x 6 cm x 4.5 cm with undermining present circumferentially, extending 1 cm. Wound bed: 10% adherent yellow slough at 9:00, 90% red nongranulating tissue, bleeds easily with cleansing.  Drainage (amount, consistency, odor) Moderate serosanguinous drainage. No odor.  Periwound: Intact.  Dressing bridged to left trochanter to offload pressure on sacrum.  Patient on low air loss bed.  Dressing procedure/placement/frequency: Cleanse sacral ulcer with NS and pat gently dry.  Gently fill wound bed with black granufoam.  Place drape in intact skin to left trochanter for bridging.  Cover with VAC drape.  Seal obtained at 125 mm Hg.  Change Mon/Wed/Fri. WOC team will follow.  Maple HudsonKaren An Lannan RN BSN CWON Pager [phone]  WOC ostomy consult note Stoma type/location: LLQ colostomy Stomal assessment/size: not assessed Peristomal assessment: not assessed Treatment options for stomal/peristomal skin: None at this time Output loose brown stool Ostomy pouching: 2 pc  2 1/4"   Education provided: Pouch intact.  Patient will not be performing his own self care.  Will change pouch Tuesday.   Maple HudsonKaren Anahy Esh RN BSN CWON Pager [phone]

## 2014-08-29 NOTE — Progress Notes (Signed)
Inpatient Diabetes Program Recommendations  AACE/ADA: New Consensus Statement on Inpatient Glycemic Control (2013)  Target Ranges:  Prepandial:   less than 140 mg/dL      Peak postprandial:   less than 180 mg/dL (1-2 hours)      Critically ill patients:  140 - 180 mg/dL   Reason for Assessment:  Results for Sean Morgan, Sean Morgan (MRN [id number]) as of 08/29/2014 14:38  Ref. Range 08/28/2014 11:35 08/28/2014 16:26 08/28/2014 20:51 08/29/2014 00:16 08/29/2014 08:05  Glucose-Capillary Latest Ref Range: 70-99 mg/dL 191251 (H) 478195 (H) 295124 (H) 124 (H) 190 (H)  Results for Sean Morgan, Sean Morgan (MRN [id number]) as of 08/29/2014 14:38  Ref. Range 08/23/2014 03:48  Hemoglobin A1C Latest Units: % 7.5 (H)   Diabetes history: Type 2 diabetes Outpatient Diabetes medications: Glipizide 5 mg bid Current orders for Inpatient glycemic control: Novolog 151-200-4 units, 201-250-8 units, 251-300-12 units, etc.  AC and HS  Consider discontinuation of current Novolog sliding scale and consider "Glycemic control order set" .  May consider adding Levemir 10 units daily and Novolog moderate scale tid with meals with bedtime scale.    Thanks, Beryl MeagerJenny Myriah Boggus, RN, BC-ADM Inpatient Diabetes Coordinator Pager [phone] (8a-5p)

## 2014-08-29 NOTE — Progress Notes (Signed)
Nutrition Follow-up    INTERVENTION:  Medical Nutrition Supplement Therapy: Continue Mighty Shakes TID and Magic Cup TID, provide assistance at meal times.  NUTRITION DIAGNOSIS:  Inadequate oral intake related to acute illness as evidenced by meal completion < 50% but being addressed with supplements, encourage at meal times   GOAL:  Energy Intake: goal for pt to eat at least 60% of meals and at least 2 supplements  MONITOR:  Energy Intake Skin Electrolyte and Renal Profile Anthropometrics Digestive system    ASSESSMENT:  Pt remains a feeder, SLP following. Per CNA Lynna pt ate 100% of Mighty Shake and 100% of Magic Cup on breakfsat and dinner trays with 25% of cream of wheat at breakfast and 50% of mashed potatoes at lunch (1770kcals, 54g protein with supplements daily if 100% consumed)  Medications: Cipro, Lasix, SS novolog, D5 0.45%NS with KCl at 6020mL/hr  Labs: Electrolyte and Renal Profile:   Recent Labs Lab 08/26/14 0435 08/27/14 0440 08/28/14 0351 08/29/14 0523  BUN 23* 18  --  12  CREATININE 0.98 0.82  --  0.67  NA 147* 146* 145 145  K 4.0 3.8  --  4.2   Glucose Profile:   Recent Labs  08/29/14 0805 08/29/14 1212 08/29/14 1645  GLUCAP 190* 211* 206*   Height:  Ht Readings from Last 1 Encounters:  08/26/14 6' 0.99" (1.854 m)    Weight:  Wt Readings from Last 1 Encounters:  08/26/14 143 lb 1.3 oz (64.9 kg)    Ideal Body Weight:     Wt Readings from Last 10 Encounters:  08/26/14 143 lb 1.3 oz (64.9 kg)    BMI:  Body mass index is 18.88 kg/(m^2).  Kcals: 1821-2153kcals, BEE: 1380kcals  Protein: 65-77g protein (1.0-1.2g/kg)  Fluid: 1623-196647mL of fluid (25-2730ml/kg)   Skin:  Pressure ulcers unstageable  Diet Order:  DIET - DYS 1 Room service appropriate?: No; Fluid consistency:: Thin    Intake/Output Summary (Last 24 hours) at 08/29/14 1715 Last data filed at 08/29/14 1600  Gross per 24 hour  Intake   2957 ml  Output   1500  ml  Net   1457 ml    Last BM:  +stool via ostomy  MODERATE Care Level  Leda QuailAllyson Krystyl Cannell, RD, LDN Pager [phone]
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
## 2014-08-29 NOTE — Care Management Note (Signed)
Case Management Note  Patient Details  Name: Ether Griffinserry Strawderman MRN: [id number] Date of Birth: March 12, 1941  Subjective/Objective:                    Action/Plan:   Expected Discharge Date:                  Expected Discharge Plan:     In-House Referral:     Discharge planning Services     Post Acute Care Choice:    Choice offered to:     DME Arranged:    DME Agency:     HH Arranged:    HH Agency:     Status of Service:     Medicare Important Message Given:    Date Medicare IM Given:    Medicare IM give by:    Date Additional Medicare IM Given:    Additional Medicare Important Message give by:     If discussed at Long Length of Stay Meetings, dates discussed:    Additional Comments:  Marily MemosLisa M Jacobs 08/29/2014, 9:15 AM

## 2014-08-29 NOTE — Progress Notes (Signed)
No reports of dysphagia with current diet. Rec continue with pureed foods. Please notify SLP if Pt shows any s/s of aspiration. Continue with precautions.

## 2014-08-29 NOTE — Clinical Social Work Note (Signed)
[facility]. [facility] [place] [facility] [place] [facility] but it is uncertain if they can accept him long term. At this time the only facility that can accept patient definitively is [facility] [place].   RN CM has informed CSW that patient will not be able to go to LTAC due to having [employer].   SuffieldMonica Marra, MinnesotaMSW,LCSWA [phone]

## 2014-08-30 LAB — GLUCOSE, CAPILLARY
GLUCOSE-CAPILLARY: 201 mg/dL — AB (ref 70–99)
GLUCOSE-CAPILLARY: 138 mg/dL — AB (ref 70–99)
GLUCOSE-CAPILLARY: 161 mg/dL — AB (ref 70–99)
Glucose-Capillary: 148 mg/dL — ABNORMAL HIGH (ref 70–99)

## 2014-08-30 MED ORDER — MIRTAZAPINE 15 MG PO TABS
15.0000 mg | ORAL_TABLET | Freq: Every day | ORAL | Status: DC
  Administered 2014-08-30 – 2014-09-12 (×11): 15 mg via ORAL
  Filled 2014-08-30 (×14): qty 1

## 2014-08-30 MED ORDER — DRONABINOL 2.5 MG PO CAPS
2.5000 mg | ORAL_CAPSULE | Freq: Two times a day (BID) | ORAL | Status: DC
  Administered 2014-08-31 – 2014-09-13 (×18): 2.5 mg via ORAL
  Filled 2014-08-30 (×20): qty 1

## 2014-08-30 MED ORDER — POTASSIUM CHLORIDE 20 MEQ/15ML (10%) PO SOLN
20.0000 meq | Freq: Every day | ORAL | Status: DC
  Administered 2014-08-30 – 2014-09-13 (×9): 20 meq via ORAL
  Filled 2014-08-30 (×10): qty 15

## 2014-08-30 NOTE — Progress Notes (Signed)
Inpatient Diabetes Program Recommendations  AACE/ADA: New Consensus Statement on Inpatient Glycemic Control (2013)  Target Ranges:  Prepandial:   less than 140 mg/dL      Peak postprandial:   less than 180 mg/dL (1-2 hours)      Critically ill patients:  140 - 180 mg/dL   Diabetes history: Type 2 diabetes Outpatient Diabetes medications: Glipizide 5 mg bid Current orders for Inpatient glycemic control: Novolog 151-200-4 units, 201-250-8 units, 251-300-12 units, etc. AC and HS  Consider discontinuation of current Novolog sliding scale and consider "Glycemic control order set" . May consider adding Levemir 10 units daily and Novolog moderate scale tid with meals with bedtime scale.   Sean RacerJulie Taylie Helder, RN, BA, MHA, CDE Diabetes Coordinator Inpatient Diabetes Program  [phone] (Team [phone] Patrcia Dolly([place] Office) 08/30/2014 1:26 PM

## 2014-08-30 NOTE — Progress Notes (Signed)
Pt from [facility]. Plan is return to facility. CSW following. HX: quadriplegia (recent), multiple pressure ulcers with wound vac, colostomy x 2. Admitted with sepsis and encephalopathy.
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
## 2014-08-30 NOTE — Clinical Documentation Improvement (Signed)
Pt. had WOC  note on 08/29/2014 for Consult:Stage IV sacral pressure ulcer, present on admission. Surgical debridement performed 08/26/14  Please document in progress notes/discharge summary  the appropriate stage for ulcer and if POA.  . Pressure Ulcer Stage: --Stage 1 --Stage 2 --Stage 3 --Stage 4 --Unspecified   . Document any associated diagnoses/conditions . Document if ulcer (including stage) is present on admission  Supporting Information: WOC on 08/29/2014  Thank You,  Elpidio AnisGarnet Marcelis Wissner, RN, BSN, CDI [phone] [facility] HIM [facility]

## 2014-08-30 NOTE — Plan of Care (Signed)
Problem: Discharge Progression Outcomes Goal: Other Discharge Outcomes/Goals Outcome: Progressing VSS Patient had no c/o pain during the night. Repositioned throughout the night. Ostomies patent- Right has serosanguinous fluid. Left has liquid brown stool. Drains patent-  Suprapubic catheter draining yellow urine. Wound vac had no difficulties during the night. No acute changes
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
## 2014-08-31 ENCOUNTER — Encounter: Payer: Self-pay | Admitting: Internal Medicine

## 2014-08-31 LAB — PLATELET COUNT: PLATELETS: 185 10*3/uL (ref 150–440)

## 2014-08-31 LAB — GLUCOSE, CAPILLARY
GLUCOSE-CAPILLARY: 120 mg/dL — AB (ref 70–99)
GLUCOSE-CAPILLARY: 147 mg/dL — AB (ref 70–99)
GLUCOSE-CAPILLARY: 168 mg/dL — AB (ref 70–99)
Glucose-Capillary: 193 mg/dL — ABNORMAL HIGH (ref 70–99)

## 2014-08-31 LAB — PREPARE RBC (CROSSMATCH)

## 2014-08-31 MED ORDER — GLUCERNA SHAKE PO LIQD
237.0000 mL | Freq: Two times a day (BID) | ORAL | Status: DC
Start: 2014-08-31 — End: 2014-09-14
  Administered 2014-08-31 – 2014-09-13 (×19): 237 mL via ORAL

## 2014-08-31 MED ORDER — SODIUM CHLORIDE 0.9 % IJ SOLN: 10.0000 mL | INTRAMUSCULAR | Status: DC | PRN

## 2014-08-31 MED ORDER — SODIUM CHLORIDE 0.9 % IJ SOLN
10.0000 mL | Freq: Two times a day (BID) | INTRAMUSCULAR | Status: DC
Start: 2014-08-31 — End: 2014-08-31
  Administered 2014-08-31: 10 mL via INTRAVENOUS

## 2014-08-31 MED ORDER — SODIUM CHLORIDE 0.9 % IJ SOLN
10.0000 mL | Freq: Two times a day (BID) | INTRAMUSCULAR | Status: DC
  Administered 2014-08-31 – 2014-09-01 (×4): 10 mL
  Administered 2014-09-02 (×2): 40 mL
  Administered 2014-09-03 (×2): 10 mL
  Administered 2014-09-04: 40 mL
  Administered 2014-09-04 – 2014-09-05 (×2): 10 mL
  Administered 2014-09-07: 20 mL
  Administered 2014-09-07 – 2014-09-10 (×2): 10 mL
  Administered 2014-09-10: 40 mL
  Administered 2014-09-10: 10 mL
  Administered 2014-09-11: 20 mL
  Administered 2014-09-11 – 2014-09-13 (×4): 10 mL

## 2014-08-31 NOTE — Progress Notes (Signed)
Patient ID: Sean Morgan, male   DOB: 12/01/1940, 74 y.o.   MRN: [id number] [facility] [place] [facility]   PATIENT NAME: Sean Morgan    MR#:  [id number]  DATE OF BIRTH:  12/01/1940  SUBJECTIVE:  CHIEF COMPLAINT:    Not somnolent today, sdmits of pain in buttocks, less in abdomen, minimal blood in wound vac, colostomy has liquid stool, suprapubic catheter and also sacral wound debridement with wound vac 4/27, HGb is stable, 9.3 y-day. POOr po intake overall, calorie count started  REVIEW OF SYSTEMS:   CONSTITUTIONAL: No fever, fatigue or weakness.  EYES: No blurred or double vision.  EARS, NOSE, AND THROAT: No tinnitus or ear pain.  RESPIRATORY: No cough, shortness of breath, wheezing or hemoptysis.  CARDIOVASCULAR: No chest pain, orthopnea, edema.  GASTROINTESTINAL: No nausea, vomiting, diarrhea or abdominal pain.  GENITOURINARY: No dysuria, hematuria.  ENDOCRINE: No polyuria, nocturia,  HEMATOLOGY: No anemia, easy bruising or bleeding SKIN: No rash or lesion. MUSCULOSKELETAL: No joint pain or arthritis.   NEUROLOGIC: No tingling, numbness, weakness.  PSYCHIATRY: No anxiety or depression.   VITAL SIGNS: Blood pressure 136/60, pulse 82, temperature 97.8 F (36.6 C), temperature source Oral, resp. rate 19, height 6' 0.99" (1.854 m), weight 64.9 kg (143 lb 1.3 oz), SpO2 98 %.  PHYSICAL EXAMINATION:   GENERAL:  74 y.o.-year-old patient lying in the bed with no acute distress. Some somnolent, confused, tells me he can move his arms, hands, can manipulate TV remote control, but when asked to move fingers, can not do this EYES: Pupils equal, round, reactive to light and accommodation. No scleral icterus. Extraocular muscles intact.  HEENT: Head atraumatic, normocephalic. Oropharynx and nasopharynx clear.  NECK:  Supple, no jugular venous distention. No thyroid enlargement, no tenderness.  LUNGS: Normal breath sounds bilaterally, no wheezing,  rales,rhonchi or crepitation. No use of accessory muscles of respiration.  CARDIOVASCULAR: S1, S2 normal. No murmurs, rubs, or gallops.  ABDOMEN: Soft, nontender, nondistended. Bowel sounds present. No organomegaly or mass. 2 somas noted, one more R sided contains serosanguinous fluid, more L sided contains liquidly stool, GT has been removed, suprapubic cath placed, no tenderness, swelling, bleeding, urine is draining EXTREMITIES: significant pedal edema, no cyanosis, or clubbing. Upper extremities, perineal area are swollen NEUROLOGIC: Cranial nerves II through XII are intact. Muscle strength  not able to obtain, not moving lower extremities. Sensation grossly intact in upper body. Gait not checked. Has intermittent myoclonus today.  PSYCHIATRIC: The patient is alert and oriented x 3.  SKIN: No obvious rash, lesion, or ulcer. Dressings  On the back, not seen  ORDERS/RESULTS REVIEWED:   CBC  Recent Labs Lab 08/25/14 0624 08/26/14 0435 08/26/14 1856 08/27/14 0440 08/28/14 0351 08/28/14 2046 08/29/14 0523 08/31/14 0956  WBC 5.9 4.4  --  6.1  --   --   --   --   HGB 7.6* 6.7* 8.3* 7.7* 7.3* 8.5* 9.3*  --   HCT 24.3* 21.8* 26.1* 23.6* 22.7* 26.1*  --   --   PLT 283 231  --  231  --   --   --  185  MCV 90 91  --  90  --   --   --   --   MCH 28.2 28.0  --  29.4  --   --   --   --   MCHC 31.2* 30.8*  --  32.6  --   --   --   --   RDW  16.2* 16.7*  --  16.6*  --   --   --   --   LYMPHSABS 0.7* 0.7*  --  0.9*  --   --   --   --   MONOABS 0.5 0.4  --  0.5  --   --   --   --   EOSABS 0.1 0.1  --  0.1  --   --   --   --   BASOSABS 0.0 0.0  --  0.0  --   --   --   --    ------------------------------------------------------------------------------------------------------------------  Chemistries   Recent Labs Lab 08/25/14 0624 08/26/14 0435 08/27/14 0440 08/28/14 0351 08/29/14 0523  NA 144 147* 146* 145 145  K 4.2 4.0 3.8  --  4.2  CL 111 114* 115*  --  114*  CO2 24 26 26   --  25   GLUCOSE 145* 153* 179*  --  178*  BUN 27* 23* 18  --  12  CREATININE 1.18 0.98 0.82  --  0.67  CALCIUM 7.8* 7.6* 7.6*  --  7.8*   ------------------------------------------------------------------------------------------------------------------ estimated creatinine clearance is 72.1 mL/min (by C-G formula based on Cr of 0.67). ------------------------------------------------------------------------------------------------------------------ No results for input(s): TSH, T4TOTAL, T3FREE, THYROIDAB in the last 72 hours.  Invalid input(s): FREET3  Cardiac Enzymes No results for input(s): CKMB, TROPONINI, MYOGLOBIN in the last 168 hours.  Invalid input(s): CK ------------------------------------------------------------------------------------------------------------------ Invalid input(s): POCBNP ---------------------------------------------------------------------------------------------------------------  RADIOLOGY: No results found.  EKG:  Orders placed or performed during the hospital encounter of 08/20/14  . EKG 12-Lead  . EKG 12-Lead    ASSESSMENT AND PLAN:  74y/oM with High Blood Pressure (Hypertension) and DIABETES MELLITUS, [facility] for sepsis and metabolic encpehalopathy  * Sepsis likely due to UTI, per ID- urine cultures with klebsiella, blood cultures with coagulase negative staph and sacral decub cultures with klebsiella and VRE. Off zosyn and Zyvox , now on Daptomycin iv, po cipro and flagyl per ID recommendations, needs 4-6 weeks of iv abx Appreciate surgical consult- Pt had sacral wound debridement and wound vac placed 4/27, with diverting colostomy and a suprapubic catheter placement. s/p PICC placement 4/29  chest xray with LL infiltrate-? aspiration- [facility] speech therapy consulted- on puree diet with thin liquids. Relatively stable, , still poor PO intake, getting palliative care  input,  started  Marinol, calorie count initiated * Metabolic encephalopathy- improved overall, follwoing, no focal deficit  * ARF- ATN from sepsis, resolved, off IV fluids- monitoring closely as PO intake is poor .  * Hypernatremia- Resolved on d5 half ns , Na is stable, off IVF, watch it carefully, may creap up as po intake is poor, also on thickened diet.   * Anemia- acute on chronic- post op / dilutional too- transfused PRBC , , Hb has improved, 9.3 recently  * HTN- continue metoprolol bid, off IVF, BP has improved off IVF  * DM- on Sliding Scale Insulin, hba1c 7.5  * DVT prophylaxis, refused heparin shot  * gen weakness, PT, likley back to SNF  d/w SW todayagain, with hospice?   Heparin subcutaneous             DRUG ALLERGIES: No Known Allergies  CODE STATUS: full    Code Status Orders        Start     Ordered   08/28/14 0001  Full code   Continuous     08/27/14  1412      TOTAL TIME TAKING CARE OF THIS PATIENT: 40  minutes.  D/w palliative care , DR Drema Halon M.D on 08/31/2014 at 11:07 AM  Between 7am to 6pm - Pager - [phone]  After 6pm go to [email or web address] - password [email or web address] [facility]  [place] [facility]  Office  [phone]  CC: Primary care physician; No PCP Per Patient

## 2014-08-31 NOTE — Clinical Social Work Note (Signed)
Physician stated to CSW that patient is not eating well and that she wishes for Palliative Care to see patient again and potentially consider discharge for tomorrow.  York SpanielMonica Marra MSW,LCSWA [phone]

## 2014-08-31 NOTE — Progress Notes (Signed)
Patient A/O to person/place. Patient slept well throughout the night. Tolerated meds well in apple sauce. Changed dressing around colostomy and bilateral knees, noted some drainage at incision (staples). There is drainage noted at left knee. Patient was bath x2 person assist. Noted DTI at bilateral heels. Left ted hose off until shift round. Staff will continue to monitor and meet needs. Wound Vac and foley patent and continues to meet the patient's needs.

## 2014-08-31 NOTE — Progress Notes (Addendum)
Nutrition Follow-up  INTERVENTION:  Medical Nutrition Supplement Therapy: Will also send glucerna BID on breakfast and dinner trays as pt drinking better than eating at this time, will continue [employer]Mighty Shakes and B[employer] GroupMagic Cup on meal trays.  Coordination of Care: will post calorie count for assessment of intake.  RN Archie Pattenonya and CNA Kerrie aware.  NUTRITION DIAGNOSIS:  Inadequate oral intake related to acute illness as evidenced by meal completion < 50%, being addressed with calorie count, supplements and encouragement of po intake  GOAL: Goal for pt to eat atleast 60% of meals and atleast 2 supplements daily; to be recorded by calorie count  MONITOR: Energy Intake Skin Electrolyte and Renal Profile Digestive System  ASSESSMENT: Energy Intake: Per CNA Kerrie pt ate 2 bites of eggs, 4 bites of oatmeal, 100% of Magic Cup and 50% of Mighty Shake, pt ate 25% of sugar free ice cream with medications this am on visit. Pt reports liking sweet stuff on visit, likes Mighty shakes and magic cup. Pt reports having had ensure before.  Medications: Cipro, Marinol, Lasix, Novolog, Flagyl, Remeron, KCl  Labs:  Glucose Profile:  Recent Labs  08/30/14 1649 08/30/14 2127 08/31/14 0806  GLUCAP 138* 161* 120*    Height:  Ht Readings from Last 1 Encounters:  08/26/14 6' 0.99" (1.854 m)    Weight:  Wt Readings from Last 1 Encounters:  08/26/14 143 lb 1.3 oz (64.9 kg)     Wt Readings from Last 10 Encounters:  08/26/14 143 lb 1.3 oz (64.9 kg)    BMI:  Body mass index is 18.88 kg/(m^2).  Estimated Nutritional Needs:  Kcal:  1821-2153kcals, BEE: 1380kcals  Protein:  65-77g protein (1.0-1.2g/kg)  Fluid:  1623-194547mL of fluid (25-5830mL/kg)  Skin:  unstageable pressure ulcers  Diet Order:  DIET - DYS 1 Room service appropriate?: No; Fluid consistency:: Thin    Intake/Output Summary (Last 24 hours) at 08/31/14 1112 Last data filed at 08/31/14 0904  Gross per 24 hour  Intake    247 ml   Output   1600 ml  Net  -1353 ml    Last BM:  50mL output ostomy this am  HIGH Care Level  Leda QuailAllyson Yancarlos Berthold, RD, LDN Pager [phone]

## 2014-08-31 NOTE — Consult Note (Signed)
WOC wound consult note Reason for Consult:  NPWT (VAC) change. 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 Pressure Ulcer POA: Yes Measurement: N/A Wound bed:  Pink, reddened and moist with 15% slough scattered throughout woundbed. Drainage (amount, consistency, odor) Moderate amount bloody drainage without odor. Periwound:  Denuded skin to periwound protected with hydrocolloid. Dressing procedure/placement/frequency:  VAC changed every Monday, Wednesday and Friday. Conservative sharp wound debridement (CSWD performed at the bedside): N/A  Will not follow at this time.  Please re-consult if needed.  Maple HudsonKaren Juron Vorhees RN BSN CWON Pager [phone]

## 2014-09-01 DIAGNOSIS — B961 Klebsiella pneumoniae [K. pneumoniae] as the cause of diseases classified elsewhere: Secondary | ICD-10-CM

## 2014-09-01 DIAGNOSIS — N319 Neuromuscular dysfunction of bladder, unspecified: Secondary | ICD-10-CM

## 2014-09-01 DIAGNOSIS — E119 Type 2 diabetes mellitus without complications: Secondary | ICD-10-CM

## 2014-09-01 DIAGNOSIS — Z933 Colostomy status: Secondary | ICD-10-CM

## 2014-09-01 DIAGNOSIS — I10 Essential (primary) hypertension: Secondary | ICD-10-CM

## 2014-09-01 DIAGNOSIS — F329 Major depressive disorder, single episode, unspecified: Secondary | ICD-10-CM

## 2014-09-01 DIAGNOSIS — A4189 Other specified sepsis: Principal | ICD-10-CM

## 2014-09-01 DIAGNOSIS — R1310 Dysphagia, unspecified: Secondary | ICD-10-CM

## 2014-09-01 DIAGNOSIS — G8250 Quadriplegia, unspecified: Secondary | ICD-10-CM

## 2014-09-01 DIAGNOSIS — R627 Adult failure to thrive: Secondary | ICD-10-CM

## 2014-09-01 DIAGNOSIS — N390 Urinary tract infection, site not specified: Secondary | ICD-10-CM

## 2014-09-01 DIAGNOSIS — Z515 Encounter for palliative care: Secondary | ICD-10-CM

## 2014-09-01 LAB — GLUCOSE, CAPILLARY
GLUCOSE-CAPILLARY: 163 mg/dL — AB (ref 70–99)
GLUCOSE-CAPILLARY: 149 mg/dL — AB (ref 70–99)
Glucose-Capillary: 146 mg/dL — ABNORMAL HIGH (ref 70–99)
Glucose-Capillary: 186 mg/dL — ABNORMAL HIGH (ref 70–99)

## 2014-09-01 LAB — SODIUM: Sodium: 141 mmol/L (ref 135–145)

## 2014-09-01 LAB — CULTURE, BLOOD (SINGLE)

## 2014-09-01 MED ORDER — CIPROFLOXACIN IN D5W 400 MG/200ML IV SOLN
400.0000 mg | Freq: Two times a day (BID) | INTRAVENOUS | Status: DC
  Administered 2014-09-01 – 2014-09-12 (×21): 400 mg via INTRAVENOUS
  Filled 2014-09-01 (×25): qty 200

## 2014-09-01 MED ORDER — METRONIDAZOLE IN NACL 5-0.79 MG/ML-% IV SOLN
500.0000 mg | Freq: Three times a day (TID) | INTRAVENOUS | Status: DC
  Administered 2014-09-01 – 2014-09-12 (×29): 500 mg via INTRAVENOUS
  Filled 2014-09-01 (×37): qty 100

## 2014-09-01 NOTE — Plan of Care (Signed)
Problem: Phase II Progression Outcomes Goal: Tolerating diet Outcome: Not Progressing Pt refuses to eat.  MD notified.  Dietary Following.  Calorie Count.
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
## 2014-09-01 NOTE — Consult Note (Signed)
I spoke with pt's daughter, Delice Bisonara, by phone. Updated her on pt's condition. Discussed the need for PEG if we are to continue aggressive care for pt. Daughter is going to speak with pt's sister and brother. We will meet tomorrow morning to discuss further.

## 2014-09-01 NOTE — Plan of Care (Signed)
Problem: Phase III Progression Outcomes Goal: Other Phase III Outcomes/Goals Outcome: Not Applicable Date Met:  39/43/20 No discharge note/encounter noted in documents at this time.

## 2014-09-01 NOTE — Progress Notes (Signed)
Calorie Count Note  24-48 hour calorie count ordered, Day 1: 08/31/2014  Diet: Dysphagia I, thin Liquids Supplements: Mighty Shakes TID, Glucerna BID, Magic Cup TID  Breakfast: 2 bites of eggs, 4 bites of oatmeal, 100% of Magic Cup, 50% Mighty shake Lunch: 2 bites of mashed potatoes, pureed chicken and CytogeneticistMagic Cup  Dinner: 1 bite of beef, 2 bites of corn and mashed potatoes, 50% of Mighty Shake and 50% of Glucerna  Total intake: 750 kcal (41% of minimum estimated needs)  22 protein (34% of minimum estimated needs)  Nutrition Dx: Inadequate oral intake related to acute illness as evidenced by meal completion < 50%, being addressed with calorie count, supplements and encouragement of po intake   Goal: Goal for pt to eat atleast 60% of meals and atleast 2 supplements daily; to be recorded by calorie count  Intervention:  Medical Nutrition Supplement Therapy: Will also send glucerna BID on breakfast and dinner trays as pt drinking better than eating at this time, will continue [employer] Shakes and [employer] Cup on meal trays.  Coordination of Care: will continue calorie count for assessment of intake through today. RN Archie Pattenonya and CNA Tamara aware.  HIGH Care Level  Leda QuailAllyson Lelania Bia, IowaRD, [place]LDN Pager [phone]

## 2014-09-01 NOTE — Plan of Care (Signed)
Problem: Phase III Progression Outcomes Goal: Patient verbalizes diet requirements Outcome: Progressing Pt given choices however continues to refuse to eat/take meds.

## 2014-09-01 NOTE — Progress Notes (Signed)
Notified Dr. Winona LegatoVaickute that pt refused to take-in medications this AM.  Pt states "I don't want that right now."  Pt education provided and emphasis placed on the helpful action of each medication and the importance of taking medications as scheduled.  Pt stated "I don't want to get better."  Pt will not eat medications crushed or placed whole in thickened liquids--(cofirmed w/ speech that it is okay to administer the meds whole or crushed in thickened liquids).  Options given to pt, such as applesauce, magic cup, ice cream sherbet and pudding, however pt continues to refuse to take medications. Discussed changing medications to IV administration w/Dr. Winona LegatoVaickute.  Awaiting orders.  Will continue to assess patient and notify MD of any additional changes.

## 2014-09-01 NOTE — Plan of Care (Signed)
Problem: Phase I Progression Outcomes Goal: Other Phase I Outcomes/Goals Outcome: Not Applicable Date Met:  44/32/46 No discharge note/encounter noted in documents at this time.
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
## 2014-09-01 NOTE — Clinical Social Work Note (Signed)
Attending MD asked CSW to contact Palliative Care to provide patient's daughter's number so that Palliative Care could call patient's daughter to discuss whether or not she would want a PEG tube placed.  LimestoneMonica Abril Cappiello, MinnesotaMSW,LCSWA [phone]

## 2014-09-01 NOTE — Progress Notes (Signed)
Patient A, no noted distress. Administered sliding scale insulin, dosage (effective). Changed patient dressings bilateral knees, changed colostomy bags, and administered dressing at incision mid. Patient tolerated meds well. Staff will continue to monitor and meet needs.

## 2014-09-01 NOTE — Plan of Care (Signed)
Problem: Phase II Progression Outcomes Goal: Other Phase II Outcomes/Goals Outcome: Not Applicable Date Met:  54/88/30 No discharge note/encounter noted in documents at this time.

## 2014-09-01 NOTE — Plan of Care (Signed)
Problem: Phase III Progression Outcomes Goal: Pain controlled on oral analgesia Outcome: Not Applicable Date Met:  65/48/68 Pt denies pain.

## 2014-09-01 NOTE — Plan of Care (Signed)
Problem: Phase III Progression Outcomes Goal: Tolerating diet Outcome: Not Applicable Date Met:  54/98/26 Pt not eating--see previous POC/Encounter Note.

## 2014-09-02 DIAGNOSIS — Z936 Other artificial openings of urinary tract status: Secondary | ICD-10-CM

## 2014-09-02 DIAGNOSIS — R630 Anorexia: Secondary | ICD-10-CM

## 2014-09-02 DIAGNOSIS — R410 Disorientation, unspecified: Secondary | ICD-10-CM

## 2014-09-02 DIAGNOSIS — L89159 Pressure ulcer of sacral region, unspecified stage: Secondary | ICD-10-CM

## 2014-09-02 LAB — CBC
HCT: 24.7 % — ABNORMAL LOW (ref 40.0–52.0)
Hemoglobin: 7.9 g/dL — ABNORMAL LOW (ref 13.0–18.0)
MCH: 29.6 pg (ref 26.0–34.0)
MCHC: 32.1 g/dL (ref 32.0–36.0)
MCV: 92.2 fL (ref 80.0–100.0)
Platelets: 159 10*3/uL (ref 150–440)
RBC: 2.68 MIL/uL — ABNORMAL LOW (ref 4.40–5.90)
RDW: 17.1 % — ABNORMAL HIGH (ref 11.5–14.5)
WBC: 5.7 10*3/uL (ref 3.8–10.6)

## 2014-09-02 LAB — COMPREHENSIVE METABOLIC PANEL
ALT: 8 U/L — ABNORMAL LOW (ref 17–63)
ANION GAP: 5 (ref 5–15)
AST: 21 U/L (ref 15–41)
Albumin: 1.4 g/dL — ABNORMAL LOW (ref 3.5–5.0)
Alkaline Phosphatase: 65 U/L (ref 38–126)
BUN: 12 mg/dL (ref 6–20)
CALCIUM: 7.1 mg/dL — AB (ref 8.9–10.3)
CO2: 26 mmol/L (ref 22–32)
CREATININE: 0.72 mg/dL (ref 0.61–1.24)
Chloride: 110 mmol/L (ref 101–111)
GLUCOSE: 167 mg/dL — AB (ref 65–99)
Potassium: 3.6 mmol/L (ref 3.5–5.1)
Sodium: 141 mmol/L (ref 135–145)
TOTAL PROTEIN: 4.1 g/dL — AB (ref 6.5–8.1)
Total Bilirubin: 0.6 mg/dL (ref 0.3–1.2)

## 2014-09-02 LAB — GLUCOSE, CAPILLARY
GLUCOSE-CAPILLARY: 197 mg/dL — AB (ref 70–99)
Glucose-Capillary: 162 mg/dL — ABNORMAL HIGH (ref 70–99)
Glucose-Capillary: 142 mg/dL — ABNORMAL HIGH (ref 70–99)
Glucose-Capillary: 171 mg/dL — ABNORMAL HIGH (ref 70–99)

## 2014-09-02 MED ORDER — SODIUM CHLORIDE 0.9 % IV SOLN
Freq: Once | INTRAVENOUS | Status: AC
  Administered 2014-09-02: 18:00:00 via INTRAVENOUS

## 2014-09-02 MED ORDER — DIPHENHYDRAMINE HCL 50 MG/ML IJ SOLN: 25.0000 mg | Freq: Once | INTRAMUSCULAR | Status: DC

## 2014-09-02 MED ORDER — DIPHENHYDRAMINE HCL 25 MG PO CAPS
25.0000 mg | ORAL_CAPSULE | Freq: Once | ORAL | Status: AC
  Administered 2014-09-02: 25 mg via ORAL
  Filled 2014-09-02: qty 1

## 2014-09-02 MED ORDER — ACETAMINOPHEN 325 MG PO TABS
650.0000 mg | ORAL_TABLET | Freq: Once | ORAL | Status: AC
  Administered 2014-09-02: 650 mg via ORAL
  Filled 2014-09-02: qty 2
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
## 2014-09-02 NOTE — Progress Notes (Signed)
Inpatient Diabetes Program Recommendations  AACE/ADA: New Consensus Statement on Inpatient Glycemic Control (2013)  Target Ranges:  Prepandial:   less than 140 mg/dL      Peak postprandial:   less than 180 mg/dL (1-2 hours)      Critically ill patients:  140 - 180 mg/dL   Reason for assessment: elevated CBG  Diabetes history: Type 2 Outpatient Diabetes medications: Tradjenta 5mg /day, Glucotrol 5mg  bid Current orders for Inpatient glycemic control: Novolog correction 0-10 tid and hs  Please consider adding basal insulin, Lantus 7 units q day (0.1units/kg).   Susette RacerJulie Theodore Virgin, RN, BA, MHA, CDE Diabetes Coordinator Inpatient Diabetes Program  [phone] Pager) [phone] [facility] Office) 09/02/2014 3:19 PM

## 2014-09-02 NOTE — Progress Notes (Addendum)
Patient ID: Angle Karel, male   DOB: 1940/07/11, 74 y.o.   MRN: [id number] [facility] [place] [facility]   PATIENT NAME: Jevan Gaunt    MR#:  [id number]  DATE OF BIRTH:  08/19/1940  SUBJECTIVE:  CHIEF COMPLAINT:    Not somnolent today, less  pain in buttocks, in abdomen, minimal blood in wound vac, only 25 cc over 24 hours , colostomy has liquid stool, suprapubic catheter and also sacral wound debridement with wound vac 4/27, HGb is lower, 7.9 today . POOr po intake overall, calorie count started, despite Marinol, remeron, family once PEG tube placed, Dr. Marva Panda was contacted for this reason today.     VITAL SIGNS: Blood pressure 122/56, pulse 91, temperature 99.2 F (37.3 C), temperature source Oral, resp. rate 22, height 6' 0.99" (1.854 m), weight 64.9 kg (143 lb 1.3 oz), SpO2 96 %.  PHYSICAL EXAMINATION:   GENERAL:  74 y.o.-year-old patient lying in the bed with no acute distress. Some somnolent, confused, tells me he can move his arms, hands, can manipulate TV remote control, but when asked to move fingers, can not do this EYES: Pupils equal, round, reactive to light and accommodation. No scleral icterus. Extraocular muscles intact.  HEENT: Head atraumatic, normocephalic. Oropharynx and nasopharynx clear.  NECK:  Supple, no jugular venous distention. No thyroid enlargement, no tenderness.  LUNGS: Normal breath sounds bilaterally, no wheezing, rales,rhonchi or crepitation. No use of accessory muscles of respiration.  CARDIOVASCULAR: S1, S2 normal. No murmurs, rubs, or gallops.  ABDOMEN: Soft, nontender, nondistended. Bowel sounds present. No organomegaly or mass. 2 somas noted, one more R sided contains serosanguinous fluid, more L sided contains liquidly stool, GT has been removed, suprapubic cath placed, no tenderness, swelling, bleeding, urine is draining EXTREMITIES: significant pedal edema, no cyanosis, or clubbing. Upper extremities,  perineal area are swollen NEUROLOGIC: Cranial nerves II through XII are intact. Muscle strength  not able to obtain, not moving lower extremities. Sensation grossly intact in upper body. Gait not checked. Has intermittent myoclonus today.  PSYCHIATRIC: The patient is alert and oriented x 3.  SKIN: No obvious rash, lesion, or ulcer. Dressings  On the back, not seen  ORDERS/RESULTS REVIEWED:   CBC  Recent Labs Lab 08/26/14 1856 08/27/14 0440 08/28/14 0351 08/28/14 2046 08/29/14 0523 08/31/14 0956 09/02/14 0552  WBC  --  6.1  --   --   --   --  5.7  HGB 8.3* 7.7* 7.3* 8.5* 9.3*  --  7.9*  HCT 26.1* 23.6* 22.7* 26.1*  --   --  24.7*  PLT  --  231  --   --   --  185 159  MCV  --  90  --   --   --   --  92.2  MCH  --  29.4  --   --   --   --  29.6  MCHC  --  32.6  --   --   --   --  32.1  RDW  --  16.6*  --   --   --   --  17.1*  LYMPHSABS  --  0.9*  --   --   --   --   --   MONOABS  --  0.5  --   --   --   --   --   EOSABS  --  0.1  --   --   --   --   --  BASOSABS  --  0.0  --   --   --   --   --    ------------------------------------------------------------------------------------------------------------------  Chemistries   Recent Labs Lab 08/27/14 0440 08/28/14 0351 08/29/14 0523 09/01/14 0526 09/02/14 0015  NA 146* 145 145 141 141  K 3.8  --  4.2  --  3.6  CL 115*  --  114*  --  110  CO2 26  --  25  --  26  GLUCOSE 179*  --  178*  --  167*  BUN 18  --  12  --  12  CREATININE 0.82  --  0.67  --  0.72  CALCIUM 7.6*  --  7.8*  --  7.1*  AST  --   --   --   --  21  ALT  --   --   --   --  8*  ALKPHOS  --   --   --   --  65  BILITOT  --   --   --   --  0.6   ------------------------------------------------------------------------------------------------------------------ estimated creatinine clearance is 72.1 mL/min (by C-G formula based on Cr of  0.72). ------------------------------------------------------------------------------------------------------------------ No results for input(s): TSH, T4TOTAL, T3FREE, THYROIDAB in the last 72 hours.  Invalid input(s): FREET3  Cardiac Enzymes No results for input(s): CKMB, TROPONINI, MYOGLOBIN in the last 168 hours.  Invalid input(s): CK ------------------------------------------------------------------------------------------------------------------ Invalid input(s): POCBNP ---------------------------------------------------------------------------------------------------------------  RADIOLOGY: No results found.  EKG:  Orders placed or performed during the hospital encounter of 08/20/14  . EKG 12-Lead  . EKG 12-Lead    ASSESSMENT AND PLAN:  74y/oM with High Blood Pressure (Hypertension) and DIABETES MELLITUS, [facility] for sepsis and metabolic encpehalopathy  * Sepsis likely due to UTI, per ID- urine cultures with klebsiella, blood cultures with coagulase negative staph and sacral decub cultures with klebsiella and VRE. Off zosyn and Zyvox , now on Daptomycin iv, po cipro and flagyl per ID recommendations, needs 4-6 weeks of iv abx Appreciate surgical consult- Pt had sacral wound debridement and wound vac placed 4/27, with diverting colostomy and a suprapubic catheter placement. s/p PICC placement 4/29  chest xray with LL infiltrate-? aspiration- [facility] speech therapy consulted- on puree diet with thin liquids. Relatively stable, , still poor PO intake, appreciate palliative care  input, continue Marinol, calorie count initiated, family once PEG tube placed. Gastroenterology consult was initiated today.   * Metabolic encephalopathy- improved overall, follwoing, no focal deficit  * ARF- ATN from sepsis, resolved, off IV fluids- monitoring closely as PO intake is poor .  * Hypernatremia- Resolved on d5  half ns , Na is normal today, off IVF,  po intake is poor, also on thickened diet. Will  Need PEG for survival , PC d/w family, gastroenterology consult was initiated today  * Anemia- acute on chronic- post op / dilutional too- transfused PRBC , , Hb has improved, 9.3 recently. Hemoglobin is lower today at 7.9. We will transfuse 1 unit packed red blood cells prior to procedure,  PEG tube placement.  * HTN- continue metoprolol bid, off IVF, BP has improved off IVF  * DM- on Sliding Scale Insulin, hba1c 7.5  * DVT prophylaxis, refused heparin shot  * gen weakness, PT, likley back to SNF  d/w SW todayagain, with palliative care in SNF  * Dysphagia as well as moderately severe protein calorie malnutrition. Patient will have PEG tube placed by  gastroenterology  * stage 4 sacral ulcer, now s/p debridement and wound vac placement, following HGb periodically, transfusing as needed, bleeding has slowed down, only 25 cc over past 24 hours  Heparin subcutaneous if patient allows it             DRUG ALLERGIES: No Known Allergies  CODE STATUS: full    Code Status Orders        Start     Ordered   08/28/14 0001  Full code   Continuous     08/27/14 1412      TOTAL TIME TAKING CARE OF THIS PATIENT: 40 minutes.  D/w palliative care , DR Drema HalonPfifer  Fredy Gladu M.D on 09/02/2014 at 2:32 PM  Between 7am to 6pm - Pager - [phone]  After 6pm go to [email or web address] - password [email or web address] [facility]  [facility]  Office  [phone]  CC: Primary care physician; No PCP Per Patient

## 2014-09-02 NOTE — Progress Notes (Addendum)
Calorie Count Note  24-48 hr hour calorie count ordered. Day 2: 09/01/2014 results  Diet: Dysphagia 1, thin liquids Supplements: mightyshake TID, glucerna BID, magic cup TID  Breakfast: 3 bites magic cup Lunch: 1 bite puree berries and puree chicken rice soup and magic cup, 1 sip of coffee Dinner: 4 bites puree pears, 1/4 magic cup, 2 sips mightyshake   Total intake: 276 kcal (15% of minimum estimated needs)  4 g protein (6% of minimum estimated needs)  Nutrition Dx: Inadequate oral intake related to acute illness as evidenced by meal completion < 50%, being addressed with supplements and encouragement of po intake  Goal: Goal for pt to eat at least 60% of meals and 2 supplement daily to meet nutritional needs.  Pt is currently not meeting nutritional needs  Intervention: Medical nutrition Supplement: Continue sending and encouraging supplements Meals and snacks: Cater to pt prefences Coorindation of care: Spoke with  Dr. Harvie JuniorPhifer with Palliative care to discuss calorie count results for last 48 hr Palliative care planning to discuss PEG tube with family today. Dr. Harvie JuniorPhifer agreeable to discontinuing calorie count at this time.    High care level  Sean Morgan, RD, LDN [phone] (pager)
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
## 2014-09-02 NOTE — Consult Note (Signed)
Brief consult note  Diagnosis: Protein calorie malnutrition  Patient seen by consultant: Dr. Barnetta ChapelMartin Skulskie  Consult note dictated: Please see full GI consult  Recommend to proceed with surgery or procedure: Percutaneous endoscopic gastrostomy tube placement  Recommend further assessment or treatment:   Orders entered:   Discussed with Attending MD     Comments: I will need to discuss further with Dr. Michela PitcherEly did his recent colon surgery. There seems to be only a small gap between the lower rib cage and the region of the colostomy in the left abdomen. I will need to discuss further with him and rule out any other issues which may be encountered on procedure. Will plan for early in the week. Would further recommend as a bridge to start nutrition that a duotube be placed for consideration of enteric feedings.   Christena DeemSKULSKIE, MARTIN U, MD  6:30 PM 09/02/2014

## 2014-09-02 NOTE — Care Management Note (Signed)
Case Management Note  Patient Details  Name: Sean Morgan MRN: [id number] Date of Birth: [birth year]  Subjective/Objective:  Palliative Care Consult  completed today with family                 Action/Plan: PEG   Expected Discharge Date:                  Expected Discharge Plan:  IP Rehab Facility  In-House Referral:  Clinical Social Work  Discharge planning Services  CM Consult  Post Acute Care Choice:    Choice offered to:     DME Arranged:    DME Agency:     HH Arranged:    HH Agency:     Status of Service:     Medicare Important Message Given:    Date Medicare IM Given:    Medicare IM give by:    Date Additional Medicare IM Given:    Additional Medicare Important Message give by:     If discussed at Long Length of Stay Meetings, dates discussed:    Additional Comments:  Sean MemosLisa M Joniel Graumann, RN 09/02/2014, 1:25 PM

## 2014-09-02 NOTE — Progress Notes (Signed)
Pt continues to refuse meds. Took small liquid intake with encouragement. New dressing applied to supra pubic cath site and R inner Knee. B/L feet with podus boots. Pt turned and repositioned q 2hours with much opposition. RUA picc labs obtained and flushed per 2C policy. Frequent rounds done and pt location is near nursing station. Will follow up with dayshift to encourage pt to take meds.

## 2014-09-02 NOTE — Clinical Social Work Note (Signed)
Patient's family has chosen for patient to have PEG placement. [facility]  York SpanielMonica Brooklyn Jeff MSW,LCSWA [phone]

## 2014-09-03 LAB — URINALYSIS COMPLETE WITH MICROSCOPIC (ARMC ONLY)
Bilirubin Urine: NEGATIVE
Glucose, UA: NEGATIVE mg/dL
Ketones, ur: NEGATIVE mg/dL
Nitrite: NEGATIVE
Protein, ur: 30 mg/dL — AB
SPECIFIC GRAVITY, URINE: 1.024 (ref 1.005–1.030)
Trans Epithel, UA: 2
pH: 5 (ref 5.0–8.0)

## 2014-09-03 LAB — TYPE AND SCREEN
ABO/RH(D): B POS
ANTIBODY SCREEN: NEGATIVE
UNIT DIVISION: 0

## 2014-09-03 LAB — HEMOGLOBIN AND HEMATOCRIT, BLOOD
HCT: 30.5 % — ABNORMAL LOW (ref 40.0–52.0)
Hemoglobin: 9.9 g/dL — ABNORMAL LOW (ref 13.0–18.0)

## 2014-09-03 LAB — GLUCOSE, CAPILLARY
GLUCOSE-CAPILLARY: 148 mg/dL — AB (ref 70–99)
GLUCOSE-CAPILLARY: 229 mg/dL — AB (ref 70–99)
Glucose-Capillary: 152 mg/dL — ABNORMAL HIGH (ref 70–99)
Glucose-Capillary: 214 mg/dL — ABNORMAL HIGH (ref 70–99)

## 2014-09-03 NOTE — Plan of Care (Signed)
Problem: Problem: Skin/Wound Progression Goal: HEALING PRESSURE ULCER Outcome: Not Progressing 2 new pressure ulcer posterior R buttock. Wound consult entered. Barrier cream applied  Problem: Consults Goal: Diabetes Mellitus Patient Education See Patient Education Module for education specifics.  Outcome: Progressing Needs reinforcement   Problem: Phase I Progression Outcomes Goal: Voiding-avoid urinary catheter unless indicated Outcome: Not Met (add Reason) Catheter needed  Problem: Discharge Progression Outcomes Goal: Pain controlled with appropriate interventions Outcome: Not Applicable Date Met:  19/91/44 Pt denies pain

## 2014-09-03 NOTE — Progress Notes (Signed)
Pt has 2 new pressure ulcer on R buttock. Posterior Scrotum area appears to have some irritation.  Advise nurse tech to proceed with barrier cream and cover with foam after bed bath.  Dr. Dimas AguasHoward notified and a wound consult was requested.  Pt also has greenish color pus coming out of his penis.  UA order entered

## 2014-09-03 NOTE — Progress Notes (Signed)
Subjective: Patient seen for possible PEG tube placement. Patient is currently at baseline. He denies nausea or abdominal pain. I discussed with nursing earlier today and he is able to take food orally he is assisted.  Objective: Vital signs in last 24 hours: Temp:  [98.6 F (37 C)-99.9 F (37.7 C)] 98.7 F (37.1 C) (05/07 1623) Pulse Rate:  [48-110] 48 (05/07 1623) Resp:  [16-21] 19 (05/07 1623) BP: (115-135)/(44-89) 133/89 mmHg (05/07 1623) SpO2:  [96 %-100 %] 97 % (05/07 1623) Blood pressure 133/89, pulse 48, temperature 98.7 F (37.1 C), temperature source Oral, resp. rate 19, height 6' 0.99" (1.854 m), weight 64.9 kg (143 lb 1.3 oz), SpO2 97 %.   Intake/Output from previous day: 05/06 0701 - 05/07 0700 In: 887 [I.V.:67; Blood:320; IV Piggyback:500] Out: 350 [Urine:350]  Intake/Output this shift: Total I/O In: 330 [P.O.:180; IV Piggyback:150] Out: 300 [Urine:250; Stool:50]   General appearance:  Elderly no acute distress Resp:  Clear to auscultation Cardio:  Regular rate and rhythm GI:  Soft nontender nondistended bowel sounds positive. 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 Extremities:  No clubbing or cyanosis   Lab Results: Results for orders placed or performed during the hospital encounter of 08/20/14 (from the past 24 hour(s))  Glucose, capillary     Status: Abnormal   Collection Time: 09/02/14  8:48 PM  Result Value Ref Range   Glucose-Capillary 171 (H) 70 - 99 mg/dL  Hemoglobin and hematocrit, blood     Status: Abnormal   Collection Time: 09/03/14  5:37 AM  Result Value Ref Range   Hemoglobin 9.9 (L) 13.0 - 18.0 g/dL   HCT 82.930.5 (L) 56.240.0 - 13.052.0 %  Glucose, capillary     Status: Abnormal   Collection Time: 09/03/14  7:41 AM  Result Value Ref Range   Glucose-Capillary 152 (H) 70 - 99 mg/dL  Glucose, capillary     Status: Abnormal   Collection Time: 09/03/14 11:14 AM  Result Value Ref Range   Glucose-Capillary 148 (H) 70 - 99 mg/dL   Comment 1 Notify RN   Urinalysis complete, with microscopic [facility])     Status: Abnormal   Collection Time: 09/03/14  2:30 PM  Result Value Ref Range   Color, Urine AMBER (A) YELLOW   APPearance CLOUDY (A) CLEAR   Glucose, UA NEGATIVE NEGATIVE mg/dL   Bilirubin Urine NEGATIVE NEGATIVE   Ketones, ur NEGATIVE NEGATIVE mg/dL   Specific Gravity, Urine 1.024 1.005 - 1.030   Hgb urine dipstick 1+ (A) NEGATIVE   pH 5.0 5.0 - 8.0   Protein, ur 30 (A) NEGATIVE mg/dL   Nitrite NEGATIVE NEGATIVE   Leukocytes, UA 3+ (A) NEGATIVE   RBC / HPF TOO NUMEROUS TO COUNT 0 - 5 RBC/hpf   WBC, UA TOO NUMEROUS TO COUNT 0 - 5 WBC/hpf   Bacteria, UA RARE (A) NONE SEEN   Squamous Epithelial / LPF 0-5 (A) NONE SEEN   Trans Epithel, UA 2    WBC Clumps PRESENT    Mucous PRESENT    Budding Yeast PRESENT   Glucose, capillary     Status: Abnormal   Collection Time: 09/03/14  4:22 PM  Result Value Ref Range   Glucose-Capillary 229 (H) 70 - 99 mg/dL   Comment 1 Notify RN       Recent Labs  09/02/14 0552 09/03/14 0537  WBC 5.7  --   HGB 7.9* 9.9*  HCT 24.7* 30.5*  PLT 159  --    BMET  Recent Labs  09/01/14 0526 09/02/14 0015  NA 141 141  K  --  3.6  CL  --  110  CO2  --  26  GLUCOSE  --  167*  BUN  --  12  CREATININE  --  0.72  CALCIUM  --  7.1*   LFT  Recent Labs  09/02/14 0015  PROT 4.1*  ALBUMIN 1.4*  AST 21  ALT 8*  ALKPHOS 65  BILITOT 0.6   PT/INR No results for input(s): LABPROT, INR in the last 72 hours. Hepatitis Panel No results for input(s): HEPBSAG, HCVAB, HEPAIGM, HEPBIGM in the last 72 hours. C-Diff No results for input(s): CDIFFTOX in the last 72 hours. No results for input(s): CDIFFPCR in the last 72 hours.   Studies/Results: No results found.  Scheduled Inpatient Medications:   . baclofen  5 mg Oral BID  . ciprofloxacin  400 mg Intravenous Q12H  . collagenase   Topical Daily  . DAPTOmycin (CUBICIN)  IV  390 mg  Intravenous Q24H  . diphenhydrAMINE  25 mg Intravenous Once  . dronabinol  2.5 mg Oral BID AC  . feeding supplement (GLUCERNA SHAKE)  237 mL Oral BID WC  . heparin subcutaneous  5,000 Units Subcutaneous 3 times per day  . insulin aspart  0-10 Units Subcutaneous TID AC & HS  . metoprolol tartrate  25 mg Oral BID  . metronidazole  500 mg Intravenous Q8H  . mirtazapine  15 mg Oral QHS  . potassium chloride  20 mEq Oral Daily  . sodium chloride  10-40 mL Intracatheter Q12H    Continuous Inpatient Infusions:     PRN Inpatient Medications:  acetaminophen, acetaminophen, ondansetron (ZOFRAN) IV, sodium chloride, sodium chloride  Miscellaneous:   Assessment:  1. Protein calorie malnutrition with recent sepsis and chronic decubitus ulcers. 2. Altered mental status secondary to above  3. Daily oral care. Patient has apparently had oral thrush in the past.   Plan:  1. I've discussed with nursing staff and plans for calorie counts for several days. It seems that patient will readily eat when assisted. There is the question of whether he will be able to hit target goals of his metabolic demands in regards to his decubitus ulcers. Speech pathology and nutrition also following. Will await results before proceeding with PEG. I had a discussion today with his daughter/caretaker/symptom signs for medical releases and procedures and I have obtained a consent for the procedure if it is necessary. I'll discuss the placement further with Dr. Michela PitcherEly on Monday in regards to positioning and his various surgeries.  Christena DeemMartin U Leylah Tarnow MD 09/03/2014, 5:59 PM

## 2014-09-03 NOTE — Progress Notes (Signed)
Speech Language Pathology Dysphagia Treatment Patient Details Name: Sean Morgan MRN: [id number] DOB: 03/16/1941 Today's Date: 09/03/2014 Time:  -     Assessment / Plan / Recommendation Clinical Impression   SLP contacted nursing to discuss pt current status. Nursing states pt with continued poor intake of current diet. RN states that pt is not having ssx aspiration with current diet however po is poor. RN states peg tube placement is being considered for 09/05/14 if po remains poor. SLP will follow up in 1-2 days for diet toleration and intake.     Diet Recommendation    Continue with current recommended diet.    SLP Plan   Continue with current poc.  Pertinent Vitals/Pain Nursing consult only.   Swallowing Goals     General    Oral Cavity - Oral Hygiene     Dysphagia Treatment   Consulted with nursing only.     GO     Meredith PelStacie Harris Sauber 09/03/2014, 1:34 PM

## 2014-09-03 NOTE — Progress Notes (Signed)
Patient ID: Sean Morgan, male   DOB: 1940-05-17, 74 y.o.   MRN: [id number] [facility]  [facility]   PATIENT NAME: Sean Morgan    MR#:  [id number]  DATE OF BIRTH:  1940-05-17  SUBJECTIVE:  CHIEF COMPLAINT:    Not somnolent today, looks dry, weak and feels very sad. Speaks with low voice,  less  pain in buttocks, in abdomen, minimal blood in wound vac , colostomy has liquid stool, suprapubic catheter and also sacral wound debridement with wound vac 4/27, HGb is at 9.9 today . Pt is quadriplegic, feedable readily with assistance, overall, calorie count started, on Marinol, remeron, family ok with PEG tube placement if needed.     VITAL SIGNS: Blood pressure 133/89, pulse 48, temperature 98.7 F (37.1 C), temperature source Oral, resp. rate 19, height 6' 0.99" (1.854 m), weight 64.9 kg (143 lb 1.3 oz), SpO2 97 %.  PHYSICAL EXAMINATION:   GENERAL:  74 y.o.-year-old patient lying in the bed with no acute distress. Awake, confused, tells me he can move his arms, hands, can manipulate TV remote control, but when asked to move fingers, can not do this EYES: Pupils equal, round, reactive to light and accommodation. No scleral icterus. Extraocular muscles intact.  HEENT: Head atraumatic, normocephalic. Oropharynx and nasopharynx clear.  NECK:  Supple, no jugular venous distention. No thyroid enlargement, no tenderness.  LUNGS: Normal breath sounds bilaterally, no wheezing, rales,rhonchi or crepitation. No use of accessory muscles of respiration.  CARDIOVASCULAR: S1, S2 normal. No murmurs, rubs, or gallops.  ABDOMEN: Soft, nontender, nondistended. Bowel sounds present. No organomegaly or mass. 2 stomas noted, one more R sided contains serosanguinous fluid, more L sided contains liquidly stool, GT has been removed, suprapubic cath placed, no tenderness, swelling, bleeding, urine is draining EXTREMITIES: significant pedal edema, no cyanosis, or clubbing.  Upper extremities, perineal area are swollen NEUROLOGIC: Cranial nerves II through XII are intact. Muscle strength  not able to obtain, not moving lower extremities. Sensation grossly intact in upper body. Gait not checked. Has intermittent myoclonus today.  PSYCHIATRIC: The patient is alert and oriented x 3.  SKIN: Sacral dq ulcers with dressings  On the back, not seen  ORDERS/RESULTS REVIEWED:   CBC  Recent Labs Lab 08/28/14 0351 08/28/14 2046 08/29/14 0523 08/31/14 0956 09/02/14 0552 09/03/14 0537  WBC  --   --   --   --  5.7  --   HGB 7.3* 8.5* 9.3*  --  7.9* 9.9*  HCT 22.7* 26.1*  --   --  24.7* 30.5*  PLT  --   --   --  185 159  --   MCV  --   --   --   --  92.2  --   MCH  --   --   --   --  29.6  --   MCHC  --   --   --   --  32.1  --   RDW  --   --   --   --  17.1*  --    ------------------------------------------------------------------------------------------------------------------  Chemistries   Recent Labs Lab 08/28/14 0351 08/29/14 0523 09/01/14 0526 09/02/14 0015  NA 145 145 141 141  K  --  4.2  --  3.6  CL  --  114*  --  110  CO2  --  25  --  26  GLUCOSE  --  178*  --  167*  BUN  --  12  --  12  CREATININE  --  0.67  --  0.72  CALCIUM  --  7.8*  --  7.1*  AST  --   --   --  21  ALT  --   --   --  8*  ALKPHOS  --   --   --  65  BILITOT  --   --   --  0.6   ------------------------------------------------------------------------------------------------------------------ estimated creatinine clearance is 72.1 mL/min (by C-G formula based on Cr of 0.72). ------------------------------------------------------------------------------------------------------------------ No results for input(s): TSH, T4TOTAL, T3FREE, THYROIDAB in the last 72 hours.  Invalid input(s): FREET3  Cardiac Enzymes No results for input(s): CKMB, TROPONINI, MYOGLOBIN in the last 168 hours.  Invalid input(s):  CK ------------------------------------------------------------------------------------------------------------------ Invalid input(s): POCBNP ---------------------------------------------------------------------------------------------------------------  RADIOLOGY: No results found.  EKG:  Orders placed or performed during the hospital encounter of 08/20/14  . EKG 12-Lead  . EKG 12-Lead    ASSESSMENT AND PLAN:  74y/oM with High Blood Pressure (Hypertension) and DIABETES MELLITUS, [facility] for sepsis and metabolic encpehalopathy  * Sepsis likely due to UTI, per ID- urine cultures with klebsiella, blood cultures with coagulase negative staph and sacral decub cultures with klebsiella and VRE. Off zosyn and Zyvox , now on Daptomycin iv, po cipro and flagyl per ID recommendations, needs 4-6 weeks of iv abx Appreciate surgical consult- Pt had sacral wound debridement and wound vac placed 4/27, with diverting colostomy and a suprapubic catheter placement. s/p PICC placement 4/29  chest xray with LL infiltrate-? aspiration- [facility]  * Acute depression-  Pt admits, decreased PO intake, consult psychiatry  * Anemia- acute on chronic- post op / dilutional too- transfused PRBC , , Hb has improved, 9.9 recently.  * HTN- continue metoprolol bid, off IVF, BP has improved off IVF  * DM- on Sliding Scale Insulin, hba1c 7.5  * DVT prophylaxis, refused heparin shot  * gen weakness, PT, likley back to SNF , with palliative care in SNF  * Dysphagia as well as moderately severe protein calorie malnutrition. speech therapy consulted- on puree diet with thin liquids. Relatively stable, , still poor PO intake, appreciate palliative care  input, continue Marinol, calorie count , family ok with PEG tube placement if needed . It seems that patient will readily eat when assisted. There is the question of whether he will be able to hit  target goals of his metabolic demands in regards to his decubitus ulcers. Speech pathology and nutrition also following. GI prefers to wait for the  results before proceeding with PEG.Marland Kitchen. Appreciate Gastroenterology consult.   * Metabolic encephalopathy- improved overall, follwoing, no focal deficit  * ARF- ATN from sepsis, resolved, off IV fluids- monitoring closely as PO intake is poor .  * Hypernatremia- Resolved on d5 half ns , Na is normal today, off IVF,  po intake is poor, also on thickened diet. Will  Need PEG for survival , PC d/w family, gastroenterology consult was initiated today  Patient will have PEG tube placed by gastroenterology  * stage 4 sacral ulcer, now s/p debridement and wound vac placement, following HGb periodically, transfusing as needed, bleeding has slowed down, only 25 cc over past 24 hours  Heparin subcutaneous if patient allows it             DRUG ALLERGIES: No Known Allergies  CODE STATUS: full    Code Status Orders        Start  Ordered   08/28/14 0001  Full code   Continuous     08/27/14 1412      TOTAL TIME TAKING CARE OF THIS PATIENT: 35 minutes.  D/w palliative care , DR Trevor Iha M.D on 09/03/2014 at 6:41 PM  Between 7am to 6pm - Pager - [phone]  After 6pm go to [email or web address] - password [email or web address] [facility]  [facility]  Office  [phone]  CC: Primary care physician; No PCP Per Patient

## 2014-09-03 NOTE — Progress Notes (Signed)
Nutrition Follow-up  INTERVENTION: Medical Food Supplement Therapy: recommend continuing as ordered Glucerna shake, Magic cup  Meals and Snacks: Cater to patient preferences Coordination of Care: Spoke with MD, Gouru regarding nutrition poc; MD requesting calorie count continued through the weekend. MD requesting much encouragement at meal times with Nsg as pt a feeder; spoke with Nsg aware and will continue to provide. Will post calorie count through the weekend.  NUTRITION DIAGNOSIS:  Inadequate oral intake related to acute illness as evidenced by meal completion < 50%, ongoing  GOAL:  Other (Comment) (pt to eat atleast 60% of meals and atleast 2 supplements)   MONITOR: Energy Intake Skin Electrolyte and Renal Profile Digestive System  ASSESSMENT:  Per MD note, plan for PEG early next week, further recommend duotube for enteral feeds. MD Gouru requesting calorie count.  Medications: reviewed  Labs: Electrolyte and Renal Profile:    Recent Labs Lab 08/29/14 0523 09/01/14 0526 09/02/14 0015  BUN 12  --  12  CREATININE 0.67  --  0.72  NA 145 141 141  K 4.2  --  3.6   Glucose Profile:  Recent Labs  09/02/14 2048 09/03/14 0741 09/03/14 1114  GLUCAP 171* 152* 148*   Protein Profile:  Recent Labs Lab 09/02/14 0015  ALBUMIN 1.4*    Height:  Ht Readings from Last 1 Encounters:  08/26/14 6' 0.99" (1.854 m)    Weight:  Wt Readings from Last 1 Encounters:  08/26/14 143 lb 1.3 oz (64.9 kg)    Ideal Body Weight:     Wt Readings from Last 10 Encounters:  08/26/14 143 lb 1.3 oz (64.9 kg)    BMI:  Body mass index is 18.88 kg/(m^2).  Estimated Nutritional Needs:  Kcal:  1821-2153kcals, BEE: 1380kcals  Protein:  65-77g protein (1.0-1.2g/kg)  Fluid:  1623-195647mL of fluid (25-4830mL/kg)  Diet Order:  DIET - DYS 1 Room service appropriate?: No; Fluid consistency:: Thin   Intake/Output Summary (Last 24 hours) at 09/03/14 1449 Last data filed at  09/03/14 1211  Gross per 24 hour  Intake    967 ml  Output    500 ml  Net    467 ml    Last BM:  5/7  HIGH Care Level  Leda QuailAllyson Arlie Posch, RD, LDN Pager [phone]

## 2014-09-04 DIAGNOSIS — R4182 Altered mental status, unspecified: Secondary | ICD-10-CM | POA: Insufficient documentation

## 2014-09-04 DIAGNOSIS — M62838 Other muscle spasm: Secondary | ICD-10-CM

## 2014-09-04 LAB — CBC
HCT: 28.1 % — ABNORMAL LOW (ref 40.0–52.0)
Hemoglobin: 9.3 g/dL — ABNORMAL LOW (ref 13.0–18.0)
MCH: 29.5 pg (ref 26.0–34.0)
MCHC: 33 g/dL (ref 32.0–36.0)
MCV: 89.4 fL (ref 80.0–100.0)
PLATELETS: 161 10*3/uL (ref 150–440)
RBC: 3.14 MIL/uL — ABNORMAL LOW (ref 4.40–5.90)
RDW: 19.3 % — AB (ref 11.5–14.5)
WBC: 4.7 10*3/uL (ref 3.8–10.6)

## 2014-09-04 LAB — GLUCOSE, CAPILLARY
GLUCOSE-CAPILLARY: 159 mg/dL — AB (ref 70–99)
Glucose-Capillary: 105 mg/dL — ABNORMAL HIGH (ref 70–99)
Glucose-Capillary: 163 mg/dL — ABNORMAL HIGH (ref 70–99)
Glucose-Capillary: 220 mg/dL — ABNORMAL HIGH (ref 70–99)

## 2014-09-04 LAB — PREPARE RBC (CROSSMATCH)

## 2014-09-04 MED ORDER — LORAZEPAM 2 MG/ML IJ SOLN
2.0000 mg | INTRAMUSCULAR | Status: DC | PRN
  Administered 2014-09-04: 2 mg via INTRAVENOUS
  Filled 2014-09-04: qty 1

## 2014-09-04 MED ORDER — BACLOFEN 1 MG/ML ORAL SUSPENSION: 10.0000 mg | Freq: Three times a day (TID) | ORAL | Status: DC

## 2014-09-04 MED ORDER — CARBAMAZEPINE 200 MG PO TABS
200.0000 mg | ORAL_TABLET | Freq: Two times a day (BID) | ORAL | Status: DC
Start: 2014-09-04 — End: 2014-09-14
  Administered 2014-09-04 – 2014-09-13 (×14): 200 mg via ORAL
  Filled 2014-09-04 (×18): qty 1

## 2014-09-04 MED ORDER — BACLOFEN 10 MG PO TABS
10.0000 mg | ORAL_TABLET | Freq: Three times a day (TID) | ORAL | Status: DC
  Administered 2014-09-04 – 2014-09-13 (×20): 10 mg via ORAL
  Filled 2014-09-04 (×24): qty 1
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
## 2014-09-04 NOTE — Consult Note (Signed)
CC: L  Hemispheric tremor.   HPI: 74 y/o male admitted with sacral decubitus/sepsis on 08/20/14. Neurology consulted for L hemispheric and L hemifacial muscle spasm. When questioned pt states the spasm has been happening for at least 3-4 months now.  He states he has difficulty controlling it.      History reviewed. No pertinent past medical history.  History reviewed. No pertinent past surgical history.  History reviewed. No pertinent family history.  Social History:  reports that he has quit smoking. He does not have any smokeless tobacco history on file. He reports that he does not drink alcohol or use illicit drugs.  No Known Allergies  Medications: I have reviewed the patient's current medications.  ROS: Unable to obtain due to current mental  status  Physical Examination: Blood pressure 100/65, pulse 89, temperature 98.4 F (36.9 C), temperature source Oral, resp. rate 15, height 6' 0.99" (1.854 m), weight 64.9 kg (143 lb 1.3 oz), SpO2 98 %.  Pt is able to follow  Commands Tells me the name of hospital and name EOM intact Facial  Sensation intact, facial motor intact Motor generalized weakness b/l upper and lower extremities.  Reflexes diminished.    Laboratory Studies:   Basic Metabolic Panel:  Recent Labs Lab 08/29/14 0523 09/01/14 0526 09/02/14 0015  NA 145 141 141  K 4.2  --  3.6  CL 114*  --  110  CO2 25  --  26  GLUCOSE 178*  --  167*  BUN 12  --  12  CREATININE 0.67  --  0.72  CALCIUM 7.8*  --  7.1*    Liver Function Tests:  Recent Labs Lab 09/02/14 0015  AST 21  ALT 8*  ALKPHOS 65  BILITOT 0.6  PROT 4.1*  ALBUMIN 1.4*   No results for input(s): LIPASE, AMYLASE in the last 168 hours. No results for input(s): AMMONIA in the last 168 hours.  CBC:  Recent Labs Lab 08/28/14 2046 08/29/14 0523 08/31/14 0956 09/02/14 0552 09/03/14 0537 09/04/14 0449  WBC  --   --   --  5.7  --  4.7  HGB 8.5* 9.3*  --  7.9* 9.9* 9.3*  HCT 26.1*  --    --  24.7* 30.5* 28.1*  MCV  --   --   --  92.2  --  89.4  PLT  --   --  185 159  --  161    Cardiac Enzymes:  Recent Labs Lab 08/29/14 0523  CKTOTAL 64    BNP: Invalid input(s): POCBNP  CBG:  Recent Labs Lab 09/03/14 1114 09/03/14 1622 09/03/14 2138 09/04/14 0736 09/04/14 1252  GLUCAP 148* 229* 214* 105* 163*    Microbiology: Results for orders placed or performed during the hospital encounter of 08/20/14  Culture, blood (single)     Status: None   Collection Time: 08/20/14  2:48 PM  Result Value Ref Range Status   Micro Text Report   Final       ORGANISM 1                COAGULASE NEGATIVE STAPHYLOCOCCUS   ORGANISM 2                CLOSTRIDIUM GROUP   COMMENT                   ORG#1 AEROBIC BOTTLE ONLY   COMMENT  POSSIBLE CONTAMINATION W/SKIN FLORA   COMMENT                   ORG#2 ANAEROBIC BOTTLE ONLY   GRAM STAIN                GRAM POSITIVE COCCI   GRAM STAIN                GRAM POSITIVE ROD   ANTIBIOTIC                    ORG#1    ORG#2     BETA-LACTAMASE                         POSITIVE    Culture, blood (single)     Status: None   Collection Time: 08/20/14  2:48 PM  Result Value Ref Range Status   Micro Text Report   Final       ORGANISM 1                ANAEROBIC GRAM POSITIVE COCCI   ORGANISM 2                PARABACTEROIDES SPECIES   COMMENT                   IN ANAEROBE BOTTLE ONLY   COMMENT                   UNABLE TO IDENTIFY ORGANISM #1 FURTHER   GRAM STAIN                GRAM POSITIVE COCCI   ANTIBIOTIC                    ORG#1    ORG#2     BETA-LACTAMASE                POSITIVE POSITIVE    Urine culture     Status: None   Collection Time: 08/20/14  3:43 PM  Result Value Ref Range Status   Micro Text Report   Final       SOURCE: INDWELLING CATH    ORGANISM 1                >100,000 CFU/ML Klebsiella pneumoniae ssp pneumoni   ORGANISM 2                20,000 CFU Candida albicans   ANTIBIOTIC                     ORG#1    ORG#2     AMPICILLIN                    R                  CEFAZOLIN                     S                  CEFOXITIN                     R                  CEFTRIAXONE                   S  CIPROFLOXACIN                 S                  GENTAMICIN                    S                  IMIPENEM                      S                  LEVOFLOXACIN                  S                  NITROFURANTOIN                R                  Trimethoprim/Sulfamethoxazole S                    Wound culture     Status: None   Collection Time: 08/20/14  6:44 PM  Result Value Ref Range Status   Micro Text Report   Final       SOURCE: SACRAL DECUBITIUS    ORGANISM 1                MODERATE GROWTH KLEBSIELLA PNEUMONIAE   ORGANISM 2                MODERATE GROWTH Enterococcus faecalis   ORGANISM 3                MODERATE GROWTH VANCOMYCIN-RESISTANT ENTEROCOCCUS   COMMENT                   MIXED ANAEROBIC ORGANISMS (3 OR MORE) PRESENT   COMMENT                   CALL LAB IF FURTHER TESTING IS REQUIRED   COMMENT                   -   GRAM STAIN                RARE WHITE BLOOD CELLS   GRAM STAIN                MANY GRAM NEGATIVE COCCO-BACILLI   GRAM STAIN                MANY GRAM NEGATIVE ROD FEW GRAM POSITIVE ROD   GRAM STAIN                MANY GRAM POSITIVE COCCI IN CLUSTERS   ANTIBIOTIC                    ORG#1    ORG#2    ORG#3     AMPICILLIN                    R        S        R         CEFAZOLIN                     S  CEFOXITIN                     S                           CEFTAZIDIME                   S                           CEFTRIAXONE                    S                           CIPROFLOXACIN                 S                           GENTAMICIN                    S                           IMIPENEM                      S                           LEVOFLOXACIN                  S                            Trimethoprim/Sulfamethoxazole S                           LINEZOLID                              S        S         VANCOMYCIN                                      R         GENTAMICIN HIGH LEVEL (SYNERGY                  S             Coagulation Studies: No results for input(s): LABPROT, INR in the last 72 hours.  Urinalysis:  Recent Labs Lab 09/03/14 1430  COLORURINE AMBER*  LABSPEC 1.024  PHURINE 5.0  GLUCOSEU NEGATIVE  HGBUR 1+*  BILIRUBINUR NEGATIVE  KETONESUR NEGATIVE  PROTEINUR 30*  NITRITE NEGATIVE  LEUKOCYTESUR 3+*    Lipid Panel:  No results found for: CHOL, TRIG, HDL, CHOLHDL, VLDL, LDLCALC  HgbA1C: No results found for: HGBA1C  Urine Drug Screen:  No results found for: LABOPIA, COCAINSCRNUR, LABBENZ, AMPHETMU, THCU, LABBARB  Alcohol Level: No results for input(s): ETH in the last 168 hours.  Other results: EKG: normal EKG, normal sinus rhythm, unchanged from previous tracings.  Imaging:  No results found.   Assessment/Plan: 74 y/o male admitted with sacral decubitus/sepsis on 08/20/14. Neurology consulted for L hemispheric and L hemifacial muscle spasm. When questioned pt states the spasm has been happening for at least 3-4 months now.  He states he has difficulty controlling it.    Baclofen started 10 TID standing dose Started Tegretol 200 BID daily  Close monitoring for hyponatremia as tegretol can cause hyponatremia   Nazyia Gaugh   09/04/2014, 1:29 PM

## 2014-09-04 NOTE — Consult Note (Signed)
Subjective: Patient seen for protein calorie malnutrition. Patient was sedated earlier for agitation and minimally responsive to questions. Elevate counts ongoing. Fever noted.  Objective: Vital signs in last 24 hours: Temp:  [98.4 F (36.9 C)-102.9 F (39.4 C)] 102.3 F (39.1 C) (05/08 1845) Pulse Rate:  [79-126] 126 (05/08 1751) Resp:  [15-21] 21 (05/08 1751) BP: (100-141)/(52-65) 141/56 mmHg (05/08 1751) SpO2:  [96 %-100 %] 96 % (05/08 1751) Blood pressure 141/56, pulse 126, temperature 102.3 F (39.1 C), temperature source Axillary, resp. rate 21, height 6' 0.99" (1.854 m), weight 64.9 kg (143 lb 1.3 oz), SpO2 96 %.   Intake/Output from previous day: 05/07 0701 - 05/08 0700 In: 754 [P.O.:180; IV Piggyback:574] Out: 900 [Urine:825; Stool:75]  Intake/Output this shift:     General appearance:  Elderly-appearing Afro-American male sedated Resp:  Clear to auscultation Cardio:  Regular rate and rhythm GI:  Soft and distended sounds positive normal-appearing stool affluent in left colostomy bag Extremities:  No Clubbing or cyanosis    Lab Results: Results for orders placed or performed during the hospital encounter of 08/20/14 (from the past 24 hour(s))  Glucose, capillary     Status: Abnormal   Collection Time: 09/03/14  9:38 PM  Result Value Ref Range   Glucose-Capillary 214 (H) 70 - 99 mg/dL  CBC     Status: Abnormal   Collection Time: 09/04/14  4:49 AM  Result Value Ref Range   WBC 4.7 3.8 - 10.6 K/uL   RBC 3.14 (L) 4.40 - 5.90 MIL/uL   Hemoglobin 9.3 (L) 13.0 - 18.0 g/dL   HCT 38.728.1 (L) 56.440.0 - 33.252.0 %   MCV 89.4 80.0 - 100.0 fL   MCH 29.5 26.0 - 34.0 pg   MCHC 33.0 32.0 - 36.0 g/dL   RDW 95.119.3 (H) 88.411.5 - 16.614.5 %   Platelets 161 150 - 440 K/uL  Glucose, capillary     Status: Abnormal   Collection Time: 09/04/14  7:36 AM  Result Value Ref Range   Glucose-Capillary 105 (H) 70 - 99 mg/dL  Glucose, capillary     Status: Abnormal   Collection Time: 09/04/14 12:52 PM   Result Value Ref Range   Glucose-Capillary 163 (H) 70 - 99 mg/dL   Comment 1 Notify RN   Glucose, capillary     Status: Abnormal   Collection Time: 09/04/14  5:10 PM  Result Value Ref Range   Glucose-Capillary 220 (H) 70 - 99 mg/dL   Comment 1 Notify RN       Recent Labs  09/02/14 0552 09/03/14 0537 09/04/14 0449  WBC 5.7  --  4.7  HGB 7.9* 9.9* 9.3*  HCT 24.7* 30.5* 28.1*  PLT 159  --  161   BMET  Recent Labs  09/02/14 0015  NA 141  K 3.6  CL 110  CO2 26  GLUCOSE 167*  BUN 12  CREATININE 0.72  CALCIUM 7.1*   LFT  Recent Labs  09/02/14 0015  PROT 4.1*  ALBUMIN 1.4*  AST 21  ALT 8*  ALKPHOS 65  BILITOT 0.6   PT/INR No results for input(s): LABPROT, INR in the last 72 hours. Hepatitis Panel No results for input(s): HEPBSAG, HCVAB, HEPAIGM, HEPBIGM in the last 72 hours. C-Diff No results for input(s): CDIFFTOX in the last 72 hours. No results for input(s): CDIFFPCR in the last 72 hours.   Studies/Results: No results found.  Scheduled Inpatient Medications:   . baclofen  10 mg Oral TID  . carbamazepine  200  mg Oral BID  . ciprofloxacin  400 mg Intravenous Q12H  . collagenase   Topical Daily  . DAPTOmycin (CUBICIN)  IV  390 mg Intravenous Q24H  . diphenhydrAMINE  25 mg Intravenous Once  . dronabinol  2.5 mg Oral BID AC  . feeding supplement (GLUCERNA SHAKE)  237 mL Oral BID WC  . heparin subcutaneous  5,000 Units Subcutaneous 3 times per day  . insulin aspart  0-10 Units Subcutaneous TID AC & HS  . metoprolol tartrate  25 mg Oral BID  . metronidazole  500 mg Intravenous Q8H  . mirtazapine  15 mg Oral QHS  . potassium chloride  20 mEq Oral Daily  . sodium chloride  10-40 mL Intracatheter Q12H    Continuous Inpatient Infusions:     PRN Inpatient Medications:  acetaminophen, acetaminophen, LORazepam, ondansetron (ZOFRAN) IV, sodium chloride, sodium chloride  Miscellaneous:   Assessment:  1. Protein calorie malnutrition. She seemed to be  able to swallow adequately when encouraged however likely not reach caloric intake goal. Finish calorie counts tomorrow.  Plan:  1. Probable PEG in the next few days. Of note patient currently has been febrile, UTI versus aspiration pneumonia. Following with you  Christena DeemMartin U Meagen Limones MD 09/04/2014, 9:11 PM

## 2014-09-04 NOTE — Progress Notes (Signed)
Patient ID: Sean Morgan, male   DOB: February 03, 1941, 74 y.o.   MRN: [id number] [facility] [place] [facility]   PATIENT NAME: Sean Morgan    MR#:  [id number]  DATE OF BIRTH:  February 03, 1941  SUBJECTIVE:  CHIEF COMPLAINT:  Patient is feeling slightly better today. He really wants to eat and get better. He is asking more help  with feeding assistance as he is quadriplegic. Patient is reporting that he could eat and is reluctant to get PEG tube at this time. He is having jerky movements states that he is unable to stop them. Patient has minimal blood in wound vac , colostomy has liquid stool, suprapubic catheter and also sacral wound debridement with wound vac 4/27, HGb is at 9.9 today . Pt is quadriplegic, feedable readily with assistance, overall, calorie count started, on Marinol, remeron, family ok with PEG tube placement if needed.     VITAL SIGNS: Blood pressure 100/65, pulse 89, temperature 98.4 F (36.9 C), temperature source Oral, resp. rate 15, height 6' 0.99" (1.854 m), weight 64.9 kg (143 lb 1.3 oz), SpO2 98 %.  PHYSICAL EXAMINATION:   GENERAL:  74 y.o.-year-old patient lying in the bed with no acute distress. Awake, confused, tells me he can move his arms, hands, can manipulate TV remote control, but when asked to move fingers, can not do this EYES: Pupils equal, round, reactive to light and accommodation. No scleral icterus. Extraocular muscles intact.  HEENT: Head atraumatic, normocephalic. Oropharynx and nasopharynx clear.  NECK:  Supple, no jugular venous distention. No thyroid enlargement, no tenderness.  LUNGS: Normal breath sounds bilaterally, no wheezing, rales,rhonchi or crepitation. No use of accessory muscles of respiration.  CARDIOVASCULAR: S1, S2 normal. No murmurs, rubs, or gallops.  ABDOMEN: Soft, nontender, nondistended. Bowel sounds present. No organomegaly or mass. 2 stomas noted, one more R sided contains serosanguinous fluid,  more L sided contains liquidly stool, GT has been removed, suprapubic cath placed, no tenderness, swelling, bleeding, urine is draining EXTREMITIES: significant pedal edema, no cyanosis, or clubbing. Upper extremities, perineal area are swollen NEUROLOGIC: Cranial nerves II through XII are intact. Muscle strength  not able to obtain, not moving lower extremities. Sensation grossly intact in upper body. Gait not checked. Has intermittent myoclonus today.  PSYCHIATRIC: The patient is alert and oriented x 3.  SKIN: Sacral dq ulcers with dressings  On the back, not seen  ORDERS/RESULTS REVIEWED:   CBC  Recent Labs Lab 08/28/14 2046 08/29/14 0523 08/31/14 0956 09/02/14 0552 09/03/14 0537 09/04/14 0449  WBC  --   --   --  5.7  --  4.7  HGB 8.5* 9.3*  --  7.9* 9.9* 9.3*  HCT 26.1*  --   --  24.7* 30.5* 28.1*  PLT  --   --  185 159  --  161  MCV  --   --   --  92.2  --  89.4  MCH  --   --   --  29.6  --  29.5  MCHC  --   --   --  32.1  --  33.0  RDW  --   --   --  17.1*  --  19.3*   ------------------------------------------------------------------------------------------------------------------  Chemistries   Recent Labs Lab 08/29/14 0523 09/01/14 0526 09/02/14 0015  NA 145 141 141  K 4.2  --  3.6  CL 114*  --  110  CO2 25  --  26  GLUCOSE 178*  --  167*  BUN 12  --  12  CREATININE 0.67  --  0.72  CALCIUM 7.8*  --  7.1*  AST  --   --  21  ALT  --   --  8*  ALKPHOS  --   --  65  BILITOT  --   --  0.6   ------------------------------------------------------------------------------------------------------------------ estimated creatinine clearance is 72.1 mL/min (by C-G formula based on Cr of 0.72). ------------------------------------------------------------------------------------------------------------------ No results for input(s): TSH, T4TOTAL, T3FREE, THYROIDAB in the last 72 hours.  Invalid input(s): FREET3  Cardiac Enzymes No results for input(s): CKMB,  TROPONINI, MYOGLOBIN in the last 168 hours.  Invalid input(s): CK ------------------------------------------------------------------------------------------------------------------ Invalid input(s): POCBNP ---------------------------------------------------------------------------------------------------------------  RADIOLOGY: No results found.  EKG:  Orders placed or performed during the hospital encounter of 08/20/14  . EKG 12-Lead  . EKG 12-Lead    ASSESSMENT AND PLAN:  74y/oM with High Blood Pressure (Hypertension) and DIABETES MELLITUS, [facility] for sepsis and metabolic encpehalopathy  * Sepsis likely due to UTI, per ID- urine cultures with klebsiella, blood cultures with coagulase negative staph and sacral decub cultures with klebsiella and VRE. Off zosyn and Zyvox , now on Daptomycin iv, po cipro and flagyl per ID recommendations, needs 4-6 weeks of iv abx Appreciate surgical consult- Pt had sacral wound debridement and wound vac placed 4/27, with diverting colostomy and a suprapubic catheter placement. s/p PICC placement 4/29  chest xray with LL infiltrate-? aspiration- [facility]  * Acute depression-  Pt admits, decreased PO intake,  Psychiatry consult is pending  * Anemia- acute on chronic- post op / dilutional too- transfused PRBC , , Hb has improved, 9.3  today * HTN- continue metoprolol bid, off IVF, BP has improved off IVF  * DM- on Sliding Scale Insulin, hba1c 7.5  * DVT prophylaxis, refused heparin shot  * gen weakness, PT, likley back to SNF , with palliative care in SNF  * Dysphagia as well as moderately severe protein calorie malnutrition. speech therapy consulted- on puree diet with thin liquids. Relatively stable, , better PO intake with assistance, requesting more feeding assistance, appreciate palliative care  input, continue Marinol, calorie count , family ok with PEG tube placement if  needed . It seems that patient will readily eat when assisted. There is the question of whether he will be able to hit target goals of his metabolic demands in regards to his decubitus ulcers. Speech pathology and nutrition also following. GI prefers to wait for the  results before proceeding with PEG.Marland Kitchen. Appreciate Gastroenterology consult.   * Metabolic encephalopathy- improved overall, follwoing, no focal deficit  * ARF- ATN from sepsis, resolved, off IV fluids- monitoring closely as PO intake is poor .  * Hypernatremia- Resolved on d5 half ns , Na is normal today, off IVF,  po intake is poor, also on thickened diet. Will  Need PEG for survival , PC d/w family, gastroenterology consult was initiated today  Patient will have PEG tube placed by gastroenterology if do not meet target calorie count * stage 4 sacral ulcer, now s/p debridement and wound vac placement, following HGb periodically, transfusing as needed, bleeding has slowed down, only 25 cc over past 24 hours  Heparin subcutaneous if patient allows it  Plan of care discussed with the patient and RN. Will prefer providing sitter continuously for providing continuous feeding assistance . I have discussed with the RN regarding frequent assistance with feeding  DRUG ALLERGIES: No Known Allergies  CODE STATUS: full    Code Status Orders        Start     Ordered   08/28/14 0001  Full code   Continuous     08/27/14 1412      TOTAL TIME TAKING CARE OF THIS PATIENT: 35 minutes.  D/w palliative care , DR Trevor Iha M.D on 09/04/2014 at 2:50 PM  Between 7am to 6pm - Pager - [phone]  After 6pm go to [email or web address] - password [email or web address] [facility]  [place] [facility]  Office  [phone]  CC: Primary care physician; No PCP Per Patient

## 2014-09-05 LAB — BASIC METABOLIC PANEL
ANION GAP: 6 (ref 5–15)
BUN: 14 mg/dL (ref 6–20)
CHLORIDE: 109 mmol/L (ref 101–111)
CO2: 28 mmol/L (ref 22–32)
Calcium: 7.6 mg/dL — ABNORMAL LOW (ref 8.9–10.3)
Creatinine, Ser: 0.7 mg/dL (ref 0.61–1.24)
GFR calc non Af Amer: >60 mL/min (ref >60)
Glucose, Bld: 131 mg/dL — ABNORMAL HIGH (ref 65–99)
POTASSIUM: 3.8 mmol/L (ref 3.5–5.1)
Sodium: 143 mmol/L (ref 135–145)

## 2014-09-05 LAB — CBC
HCT: 27.5 % — ABNORMAL LOW (ref 40.0–52.0)
Hemoglobin: 9 g/dL — ABNORMAL LOW (ref 13.0–18.0)
MCH: 29.4 pg (ref 26.0–34.0)
MCHC: 32.7 g/dL (ref 32.0–36.0)
MCV: 89.9 fL (ref 80.0–100.0)
PLATELETS: 151 10*3/uL (ref 150–440)
RBC: 3.05 MIL/uL — ABNORMAL LOW (ref 4.40–5.90)
RDW: 19.1 % — ABNORMAL HIGH (ref 11.5–14.5)
WBC: 4.6 10*3/uL (ref 3.8–10.6)

## 2014-09-05 LAB — GLUCOSE, CAPILLARY
GLUCOSE-CAPILLARY: 125 mg/dL — AB (ref 70–99)
GLUCOSE-CAPILLARY: 183 mg/dL — AB (ref 70–99)
Glucose-Capillary: 132 mg/dL — ABNORMAL HIGH (ref 70–99)
Glucose-Capillary: 134 mg/dL — ABNORMAL HIGH (ref 70–99)

## 2014-09-05 LAB — CK: CK TOTAL: 274 U/L (ref 49–397)

## 2014-09-05 MED ORDER — SODIUM CHLORIDE 0.9 % IV SOLN
INTRAVENOUS | Status: AC
  Administered 2014-09-05: 23:00:00 via INTRAVENOUS

## 2014-09-05 NOTE — Progress Notes (Signed)
Pt Alert. Pt did not want to take his medications. Dressings changed to both knee areas, gauze utilized to support both heels. Pt given bath today. Continue to assess.

## 2014-09-05 NOTE — Progress Notes (Addendum)
Calorie Count Note  48 hour calorie count ordered.  Diet: Dysphagia I, thin liquids Supplements: Glucerna BID, Sugar Free Mighty shakes TID, Magic Cup TID  Day 1: 09/03/2014 Breakfast: 75% waffle, 100% Mighty shake, bites Magic Cup and bites of oatmeal Lunch: 100% peaches, 2 bites corn chowder, 1/3 [employer] Cup Dinner: refused  Total intake: 561 kcal (31% of minimum estimated needs)  17g protein (26% of minimum estimated needs)   Day 2: 09/04/2014 Breakfast: 1 bite waffle, 50% Magic Cup, 75% Mighty Shake, 50% applesauce Lunch: 100% pears, 3 bites soup, 1 bite chicken, 1 bite green beans, 1 bite mashed potato, 50% Magic Cup, 1 bite applesauce Dinner: no ticket provided, no documentation in I/O  Total intake: 647 kcal (36% of minimum estimated needs)  18g protein (28% of minimum estimated needs)   Noted this am pt too lethargic, ate 0% per CNA Lynna  Nutrition Dx: Inadequate oral intake related to acute illness as evidenced by meal completion < 50%, being addressed with supplements and encouragement of po intake  Goal: Goal for pt to eat at least 60% of meals and 2 supplement daily to meet nutritional needs. Pt is currently not meeting nutritional needs  Intervention: Medical nutrition Supplement: Continue sending and encouraging supplements Meals and snacks: Cater to pt preferences.  Coordination of Care: noted GI MD note, for PEG this week; continue to recommend PEG at this time as pt unable to meet caloric needs at this time if aggressive care desired. Spoke with MD, Phifer and MD, Gouru aware of results.    HIGH Care Level  Leda QuailAllyson Haruto Demaria, [place]RD, [place] Pager [phone]

## 2014-09-05 NOTE — Progress Notes (Signed)
Patient ID: Sean Morgan, male   DOB: 1940-11-14, 74 y.o.   MRN: [id number] [facility] [place] [facility]   PATIENT NAME: Sean Morgan    MR#:  [id number]  DATE OF BIRTH:  1940-11-14  SUBJECTIVE:  CHIEF COMPLAINT:  Patient is feeling weak today. He really wants to eat but not reaching calori count target.  His  jerky movements are better  Patient has minimal blood in wound vac , colostomy has liquid stool, suprapubic catheter and also sacral wound debridement with wound vac 4/27, HGb is at 9.9-->9.3-->9.0  today . Pt is quadriplegic, feedable readily with assistance, overall, calorie count goal not reached at 30% only, on Marinol, remeron, family ok with PEG tube placement if needed.     VITAL SIGNS: Blood pressure 139/61, pulse 92, temperature 96.6 F (35.9 C), temperature source Axillary, resp. rate 16, height 6' 0.99" (1.854 m), weight 64.9 kg (143 lb 1.3 oz), SpO2 100 %.  PHYSICAL EXAMINATION:   GENERAL:  74 y.o.-year-old patient lying in the bed with no acute distress. Awake, emaciated, dry  EYES: Pupils equal, round, reactive to light and accommodation. No scleral icterus. Extraocular muscles intact.  HEENT: Head atraumatic, normocephalic. Oropharynx and nasopharynx clear.  NECK:  Supple, no jugular venous distention. No thyroid enlargement, no tenderness.  LUNGS: Normal breath sounds bilaterally, no wheezing, rales,rhonchi or crepitation. No use of accessory muscles of respiration.  CARDIOVASCULAR: S1, S2 normal. No murmurs, rubs, or gallops.  ABDOMEN: Soft, nontender, nondistended. Bowel sounds present. No organomegaly or mass. 2 stomas noted, one more R sided contains serosanguinous fluid, more L sided contains liquidly stool, GT has been removed, suprapubic cath placed, no tenderness, swelling, bleeding, urine is draining EXTREMITIES: significant pedal edema, no cyanosis, or clubbing. Upper extremities, perineal area are  swollen NEUROLOGIC: Cranial nerves II through XII are intact. Muscle strength  not able to obtain, not moving lower extremities. Sensation grossly intact in upper body. Gait not checked. Has intermittent myoclonus today.  PSYCHIATRIC: The patient is alert and oriented x 3.  SKIN: Sacral dq ulcers with dressings  On the back, not seen  ORDERS/RESULTS REVIEWED:   CBC  Recent Labs Lab 08/31/14 0956 09/02/14 0552 09/03/14 0537 09/04/14 0449 09/05/14 0720  WBC  --  5.7  --  4.7 4.6  HGB  --  7.9* 9.9* 9.3* 9.0*  HCT  --  24.7* 30.5* 28.1* 27.5*  PLT 185 159  --  161 151  MCV  --  92.2  --  89.4 89.9  MCH  --  29.6  --  29.5 29.4  MCHC  --  32.1  --  33.0 32.7  RDW  --  17.1*  --  19.3* 19.1*   ------------------------------------------------------------------------------------------------------------------  Chemistries   Recent Labs Lab 09/01/14 0526 09/02/14 0015 09/05/14 0720  NA 141 141 143  K  --  3.6 3.8  CL  --  110 109  CO2  --  26 28  GLUCOSE  --  167* 131*  BUN  --  12 14  CREATININE  --  0.72 0.70  CALCIUM  --  7.1* 7.6*  AST  --  21  --   ALT  --  8*  --   ALKPHOS  --  65  --   BILITOT  --  0.6  --    ------------------------------------------------------------------------------------------------------------------ estimated creatinine clearance is 72.1 mL/min (by C-G formula based on Cr of 0.7). ------------------------------------------------------------------------------------------------------------------ No results for input(s): TSH, T4TOTAL, T3FREE, THYROIDAB in  the last 72 hours.  Invalid input(s): FREET3  Cardiac Enzymes No results for input(s): CKMB, TROPONINI, MYOGLOBIN in the last 168 hours.  Invalid input(s): CK ------------------------------------------------------------------------------------------------------------------ Invalid input(s):  POCBNP ---------------------------------------------------------------------------------------------------------------  RADIOLOGY: No results found.  EKG:  Orders placed or performed during the hospital encounter of 08/20/14  . EKG 12-Lead  . EKG 12-Lead    ASSESSMENT AND PLAN:  74y/oM with High Blood Pressure (Hypertension) and DIABETES MELLITUS, [facility] and metabolic encpehalopathy  * Dysphagia as well as moderately severe protein calorie malnutrition. Only at 30 percent of calorie count goal per dietician . s/p  speech therapy consulted- on puree diet with thin liquids. appreciate palliative care  input, continue Marinol, calorie count , family ok with PEG tube placement if needed . GI planning to place PEG after talking to surgery dr.Ely regarding the anatomic location .Marland Kitchen. Appreciate Gastroenterology consult.start gentle hydration  * Sepsis likely due to UTI, per ID- urine cultures with klebsiella, blood cultures with coagulase negative staph and sacral decub cultures with klebsiella and VRE. Off zosyn and Zyvox , now on Daptomycin iv, po cipro and flagyl per ID recommendations, needs 4-6 weeks of iv abx Appreciate surgical consult- Pt had sacral wound debridement and wound vac placed 4/27, with diverting colostomy and a suprapubic catheter placement. s/p PICC placement 4/29  chest xray with LL infiltrate-? aspiration- [facility]  * Acute depression-  Pt admits, decreased PO intake,  Psychiatry consult is pending  * Anemia- acute on chronic- post op / dilutional too- transfused PRBC , , Hb has improved, 9.3  today * HTN- continue metoprolol bid, off IVF, BP has improved off IVF  * DM- on Sliding Scale Insulin, hba1c 7.5  * DVT prophylaxis, refused heparin shot  * gen weakness, PT, likley back to SNF/[facility] , with palliative care in SNF    * Metabolic encephalopathy- improved overall  * ARF- ATN  from sepsis, resolved, off IV fluids- monitoring closely as PO intake is poor .  * Hypernatremia- Resolved on d5 half ns , Na is 130   * stage 4 sacral ulcer, now s/p debridement and wound vac placement, following HGb periodically, transfusing as needed, bleeding has slowed down, only 25 cc over past 24 hours  Heparin subcutaneous if patient allows it  Plan of care discussed with the patient and RN. Will prefer providing sitter continuously for providing continuous feeding assistance . I have discussed with the RN regarding frequent assistance with feeding              DRUG ALLERGIES: No Known Allergies  CODE STATUS: full    Code Status Orders        Start     Ordered   08/28/14 0001  Full code   Continuous     08/27/14 1412      TOTAL TIME TAKING CARE OF THIS PATIENT: 35 minutes.  D/w palliative care , DR Trevor IhaPfifer  Geralynn Capri M.D on 09/05/2014 at 8:51 PM  Between 7am to 6pm - Pager - [phone]  After 6pm go to [email or web address] - password [email or web address] [facility]  [facility]  Office  [phone]  CC: Primary care physician; No PCP Per Patient

## 2014-09-05 NOTE — Consult Note (Signed)
Subjective: Patient seen for protein calorie malnutrition. Her counts ongoing. Patient denies any nausea vomiting or abdominal pain.  Objective: Vital signs in last 24 hours: Temp:  [96.6 F (35.9 C)-102.7 F (39.3 C)] 96.6 F (35.9 C) (05/09 1746) Pulse Rate:  [72-92] 92 (05/09 1746) Resp:  [16-20] 16 (05/09 0815) BP: (119-139)/(59-61) 139/61 mmHg (05/09 0815) SpO2:  [100 %] 100 % (05/09 1746) Blood pressure 139/61, pulse 92, temperature 96.6 F (35.9 C), temperature source Axillary, resp. rate 16, height 6' 0.99" (1.854 m), weight 64.9 kg (143 lb 1.3 oz), SpO2 100 %.   Intake/Output from previous day: 05/08 0701 - 05/09 0700 In: 480 [P.O.:180; IV Piggyback:300] Out: 700 [Urine:650; Stool:50]  Intake/Output this shift: Total I/O In: 0  Out: 350 [Urine:350]   General appearance:  Elderly-appearing no acute distress, answers questions appropriately. Resp:  Clear to auscultation Cardio:  Regular rate and rhythm GI:  Nontender nondistended sounds are positive. Note stool colostomy and per pubic tube. Extremities:     Lab Results: Results for orders placed or performed during the hospital encounter of 08/20/14 (from the past 24 hour(s))  Culture, blood (single)     Status: None (Preliminary result)   Collection Time: 09/04/14  7:37 PM  Result Value Ref Range   Specimen Description BLOOD    Special Requests NONE    Culture NO GROWTH < 24 HOURS    Report Status PENDING   Glucose, capillary     Status: Abnormal   Collection Time: 09/04/14 10:11 PM  Result Value Ref Range   Glucose-Capillary 159 (H) 70 - 99 mg/dL  CK     Status: None   Collection Time: 09/05/14  7:20 AM  Result Value Ref Range   Total CK 274 49 - 397 U/L  CBC     Status: Abnormal   Collection Time: 09/05/14  7:20 AM  Result Value Ref Range   WBC 4.6 3.8 - 10.6 K/uL   RBC 3.05 (L) 4.40 - 5.90 MIL/uL   Hemoglobin 9.0 (L) 13.0 - 18.0 g/dL   HCT 96.027.5 (L) 45.440.0 - 09.852.0 %   MCV 89.9 80.0 - 100.0 fL   MCH  29.4 26.0 - 34.0 pg   MCHC 32.7 32.0 - 36.0 g/dL   RDW 11.919.1 (H) 14.711.5 - 82.914.5 %   Platelets 151 150 - 440 K/uL  Basic metabolic panel     Status: Abnormal   Collection Time: 09/05/14  7:20 AM  Result Value Ref Range   Sodium 143 135 - 145 mmol/L   Potassium 3.8 3.5 - 5.1 mmol/L   Chloride 109 101 - 111 mmol/L   CO2 28 22 - 32 mmol/L   Glucose, Bld 131 (H) 65 - 99 mg/dL   BUN 14 6 - 20 mg/dL   Creatinine, Ser 5.620.70 0.61 - 1.24 mg/dL   Calcium 7.6 (L) 8.9 - 10.3 mg/dL   GFR calc non Af Amer >60 >60 mL/min   GFR calc Af Amer >60 >60 mL/min   Anion gap 6 5 - 15  Glucose, capillary     Status: Abnormal   Collection Time: 09/05/14  8:21 AM  Result Value Ref Range   Glucose-Capillary 125 (H) 70 - 99 mg/dL   Comment 1 Notify RN   Glucose, capillary     Status: Abnormal   Collection Time: 09/05/14 11:57 AM  Result Value Ref Range   Glucose-Capillary 132 (H) 70 - 99 mg/dL   Comment 1 Notify RN   Glucose, capillary  Status: Abnormal   Collection Time: 09/05/14  5:33 PM  Result Value Ref Range   Glucose-Capillary 134 (H) 70 - 99 mg/dL   Comment 1 Notify RN       Recent Labs  09/03/14 0537 09/04/14 0449 09/05/14 0720  WBC  --  4.7 4.6  HGB 9.9* 9.3* 9.0*  HCT 30.5* 28.1* 27.5*  PLT  --  161 151   BMET  Recent Labs  09/05/14 0720  NA 143  K 3.8  CL 109  CO2 28  GLUCOSE 131*  BUN 14  CREATININE 0.70  CALCIUM 7.6*   LFT No results for input(s): PROT, ALBUMIN, AST, ALT, ALKPHOS, BILITOT, BILIDIR, IBILI in the last 72 hours. PT/INR No results for input(s): LABPROT, INR in the last 72 hours. Hepatitis Panel No results for input(s): HEPBSAG, HCVAB, HEPAIGM, HEPBIGM in the last 72 hours. C-Diff No results for input(s): CDIFFTOX in the last 72 hours. No results for input(s): CDIFFPCR in the last 72 hours.   Studies/Results: No results found.  Scheduled Inpatient Medications:   . baclofen  10 mg Oral TID  . carbamazepine  200 mg Oral BID  . ciprofloxacin  400 mg  Intravenous Q12H  . collagenase   Topical Daily  . DAPTOmycin (CUBICIN)  IV  390 mg Intravenous Q24H  . diphenhydrAMINE  25 mg Intravenous Once  . dronabinol  2.5 mg Oral BID AC  . feeding supplement (GLUCERNA SHAKE)  237 mL Oral BID WC  . heparin subcutaneous  5,000 Units Subcutaneous 3 times per day  . insulin aspart  0-10 Units Subcutaneous TID AC & HS  . metoprolol tartrate  25 mg Oral BID  . metronidazole  500 mg Intravenous Q8H  . mirtazapine  15 mg Oral QHS  . potassium chloride  20 mEq Oral Daily  . sodium chloride  10-40 mL Intracatheter Q12H    Continuous Inpatient Infusions:     PRN Inpatient Medications:  acetaminophen, acetaminophen, LORazepam, ondansetron (ZOFRAN) IV, sodium chloride, sodium chloride  Miscellaneous:   Assessment:  1. Protein calorie malnutrition. Caloric counts are ongoing however best will likely approached 30% of caloric needs. He will need to have the PEG placed. We will plan for this later in the week. We'll need to discuss anatomic considerations with Dr. Michela PitcherEly did his recent surgeries. Continue oral feeds as possible.  Plan: As above   Christena DeemMartin U Mc Bloodworth MD 09/05/2014, 6:34 PM

## 2014-09-05 NOTE — Consult Note (Signed)
WOC wound consult note Reason for Consult: VAC dressing change to sacral ulcer.  Decreased PO intake Wound type: Stage IV pressure ulcer to sacrum, present on admission. Recent surgical debridement.  Pressure Ulcer POA: Yes Measurement: 9 cm x 6 cm x 4 cm Wound bed:25% yellow adherent slough.  Increased slough noted on visit today.   Drainage (amount, consistency, odor) Moderate, blood tinged exudate noted in canister.  Periwound: Intact.  Maroon discoloration noted at 9 o'clock.  Will bridge to right side to day to offload pressure to this site.  CNA at bedside.  Encouraged to turn and reposition as patient will allow every 2 hours.  Verbalizes understanding.   Dressing procedure/placement/frequency:VAC dressing changed.  Strips of hydrocolloid used at periwound site to protect.  Seal immediately achieved.   Family in room.  Discussed decreased intake and need for increased caloric intake and nutrient dense food to promote wound healing.  Family is considering PEG placement.  Bedside nurse can change VAC dressing.  MON/WED/FRI   Please re-consult if needed.  Maple HudsonKaren Anush Wiedeman RN BSN CWON Pager [phone]

## 2014-09-05 NOTE — Progress Notes (Addendum)
ST Note S: Pt was alert upon SLP entering. Breakfast tray in room untouched at 11am. Pt is on isolation precautions and cannot feed himself. Nsg reports Pt was not alert enough to eat earlier this am. Oral care provided. Pt seated fully upright in bed. Pt tolerated several sips of the mighty shake by straw without s/s of aspiration. Noted a productive cough after the last sip of Glucerna. He also tolerated 1 bite of puree without difficulty. Vocal quality remained clear. Pt began falling asleep therefore feeding discontinued. Discussed with staff who plans to feed pt lunch. Noted that MD is assessing for PEG as pt is not maintaining nutrition by PO's alone. Rec continue with current Dys 1 diet with thin liquids for now. Will need to monitor closely for s/s of aspiration. Pos remain poor.

## 2014-09-05 NOTE — Progress Notes (Signed)
Received verbal order from MD (Dr. Amado CoeGouru) regarding sitter for patient. Paged MD back to get order discontinued and instead have safety rounding. Nursing Supervisior Aware.  Awaiting reply from MD. -  No reply from MD, day shift. Continue to assess.

## 2014-09-05 NOTE — Progress Notes (Signed)
Speech Language Pathology Treatment: Dysphagia  Patient Details Name: Sean Morgan MRN: [id number] DOB: [birth year] Today's Date: 09/05/2014 Time:  -     Assessment / Plan / Recommendation Clinical Impression  Pt was alert upon SLP entering. Breakfast tray in room untouched at 11am. Pt is on isolation precautions and cannot feed himself. Nsg reports Pt was not alert enough to eat earlier this am. Oral care provided. Pt seated fully upright in bed. Pt tolerated several sips of the mighty shake by straw without s/s of aspiration. Noted a productive cough after the last sip of Glucerna. He also tolerated 1 bite of puree without difficulty. Vocal quality remained clear. Pt began falling asleep therefore feeding discontinued. Discussed with staff who plans to feed pt lunch. Noted that MD is assessing for PEG as pt is not maintaining nutrition by PO's alone. Rec continue with current Dys 1 diet with thin liquids for now. Will need to monitor closely for s/s of aspiration. Pos remain poor.         HPI Other Pertinent Information: Pt needed extra pilllows behind head to position. unable to feeed himself.   Pertinent Vitals Pain Assessment: No/denies pain  SLP Plan  Continue with current plan of care    Recommendations Diet recommendations: Dysphagia 1 (puree) (Staff may need to visit pt several times per day to offer Po's.  PEG is being considered for nutrition) Liquids provided via: Straw (Single sips at a time. MUST be fully upright. Stop if any coughing) Supervision: Trained caregiver to feed patient Compensations: Minimize environmental distractions;Small sips/bites Postural Changes and/or Swallow Maneuvers: Seated upright 90 degrees              Oral Care Recommendations: Oral care before and after PO Follow up Recommendations: Skilled Nursing facility Plan: Continue with current plan of care         Sean Morgan 09/05/2014, 12:12 PM

## 2014-09-06 LAB — CBC
HEMATOCRIT: 24.9 % — AB (ref 40.0–52.0)
Hemoglobin: 7.9 g/dL — ABNORMAL LOW (ref 13.0–18.0)
MCH: 28.4 pg (ref 26.0–34.0)
MCHC: 31.8 g/dL — ABNORMAL LOW (ref 32.0–36.0)
MCV: 89.3 fL (ref 80.0–100.0)
PLATELETS: 177 10*3/uL (ref 150–440)
RBC: 2.79 MIL/uL — ABNORMAL LOW (ref 4.40–5.90)
RDW: 18.7 % — AB (ref 11.5–14.5)
WBC: 5 10*3/uL (ref 3.8–10.6)

## 2014-09-06 LAB — GLUCOSE, CAPILLARY
GLUCOSE-CAPILLARY: 111 mg/dL — AB (ref 70–99)
Glucose-Capillary: 169 mg/dL — ABNORMAL HIGH (ref 70–99)
Glucose-Capillary: 209 mg/dL — ABNORMAL HIGH (ref 70–99)
Glucose-Capillary: 107 mg/dL — ABNORMAL HIGH (ref 70–99)

## 2014-09-06 MED ORDER — ENOXAPARIN SODIUM 40 MG/0.4ML ~~LOC~~ SOLN
40.0000 mg | SUBCUTANEOUS | Status: DC
  Administered 2014-09-08 – 2014-09-12 (×5): 40 mg via SUBCUTANEOUS
  Filled 2014-09-06 (×6): qty 0.4

## 2014-09-06 NOTE — Progress Notes (Signed)
Patient ID: Sean Morgan, male   DOB: 1941/02/10, 74 y.o.   MRN: [id number] [facility] [place] [facility]   PATIENT NAME: Sean Morgan    MR#:  [id number]  DATE OF BIRTH:  1941/02/10  SUBJECTIVE:  CHIEF COMPLAINT:  Patient is with no complaints today, okay with feeding tube Some  jerky movements intermittently Patient has minimal blood in wound vac , colostomy has liquid stool, suprapubic catheter and also sacral wound debridement with wound vac 4/27, HGb is at 9.9-->9.3-->9.0-->7.9  today . Pt is quadriplegic, feedable readily with assistance, overall, calorie count goal not reached at 30% only, on Marinol, remeron, family ok with PEG tube placement .     VITAL SIGNS: Blood pressure 112/52, pulse 93, temperature 99.7 F (37.6 C), temperature source Axillary, resp. rate 17, height 6' 0.99" (1.854 m), weight 64.9 kg (143 lb 1.3 oz), SpO2 98 %.  PHYSICAL EXAMINATION:   GENERAL:  74 y.o.-year-old patient lying in the bed with no acute distress. Awake, emaciated, dry  EYES: Pupils equal, round, reactive to light and accommodation. No scleral icterus. Extraocular muscles intact.  HEENT: Head atraumatic, normocephalic. Oropharynx and nasopharynx clear.  NECK:  Supple, no jugular venous distention. No thyroid enlargement, no tenderness.  LUNGS: Normal breath sounds bilaterally, no wheezing, rales,rhonchi or crepitation. No use of accessory muscles of respiration.  CARDIOVASCULAR: S1, S2 normal. No murmurs, rubs, or gallops.  ABDOMEN: Soft, nontender, nondistended. Bowel sounds present. No organomegaly or mass. 2 stomas noted, one more R sided contains serosanguinous fluid, more L sided contains liquidly stool, GT has been removed, suprapubic cath placed, no tenderness, swelling, bleeding, urine is draining EXTREMITIES: significant pedal edema, no cyanosis, or clubbing. Upper extremities, perineal area are swollen NEUROLOGIC: Cranial nerves II through XII  are intact. Muscle strength  not able to obtain, not moving lower extremities. Sensation grossly intact in upper body. Gait not checked. Has intermittent myoclonus today.  PSYCHIATRIC: The patient is alert and oriented x 3.  SKIN: Sacral dq ulcers with dressings  On the back, not seen  ORDERS/RESULTS REVIEWED:   CBC  Recent Labs Lab 08/31/14 0956 09/02/14 0552 09/03/14 0537 09/04/14 0449 09/05/14 0720 09/06/14 0610  WBC  --  5.7  --  4.7 4.6 5.0  HGB  --  7.9* 9.9* 9.3* 9.0* 7.9*  HCT  --  24.7* 30.5* 28.1* 27.5* 24.9*  PLT 185 159  --  161 151 177  MCV  --  92.2  --  89.4 89.9 89.3  MCH  --  29.6  --  29.5 29.4 28.4  MCHC  --  32.1  --  33.0 32.7 31.8*  RDW  --  17.1*  --  19.3* 19.1* 18.7*   ------------------------------------------------------------------------------------------------------------------  Chemistries   Recent Labs Lab 09/01/14 0526 09/02/14 0015 09/05/14 0720  NA 141 141 143  K  --  3.6 3.8  CL  --  110 109  CO2  --  26 28  GLUCOSE  --  167* 131*  BUN  --  12 14  CREATININE  --  0.72 0.70  CALCIUM  --  7.1* 7.6*  AST  --  21  --   ALT  --  8*  --   ALKPHOS  --  65  --   BILITOT  --  0.6  --    ------------------------------------------------------------------------------------------------------------------ estimated creatinine clearance is 72.1 mL/min (by C-G formula based on Cr of 0.7). ------------------------------------------------------------------------------------------------------------------ No results for input(s): TSH, T4TOTAL, T3FREE, THYROIDAB in  the last 72 hours.  Invalid input(s): FREET3  Cardiac Enzymes No results for input(s): CKMB, TROPONINI, MYOGLOBIN in the last 168 hours.  Invalid input(s): CK ------------------------------------------------------------------------------------------------------------------ Invalid input(s):  POCBNP ---------------------------------------------------------------------------------------------------------------  RADIOLOGY: No results found.  EKG:  Orders placed or performed during the hospital encounter of 08/20/14  . EKG 12-Lead  . EKG 12-Lead    ASSESSMENT AND PLAN:  74y/oM with High Blood Pressure (Hypertension) and DIABETES MELLITUS, [facility] and metabolic encpehalopathy  * Dysphagia as well as moderately severe protein calorie malnutrition. Only at 30 percent of calorie count goal per dietician . s/p  speech therapy consulted- on puree diet with thin liquids. appreciate palliative care  input, continue Marinol, calorie count , family ok with PEG tube placement if needed . GI planning to place PEG after talking to surgery dr.Ely regarding the anatomic location .Marland Kitchen. Appreciate Gastroenterology consult.continue gentle hydration, 1:1 sitter for feeding assistance while waiting for PEG placement  * Sepsis likely due to UTI, per ID- urine cultures with klebsiella, blood cultures with coagulase negative staph and sacral decub cultures with klebsiella and VRE. Off zosyn and Zyvox , now on Daptomycin iv, po cipro and flagyl per ID recommendations, needs 4-6 weeks of iv abx Appreciate surgical consult- Pt had sacral wound debridement and wound vac placed 4/27, with diverting colostomy and a suprapubic catheter placement. s/p PICC placement 4/29  chest xray with LL infiltrate-? aspiration- [facility]  * Acute depression-  Pt admits, decreased PO intake,  Psychiatry consult is pending  * Anemia- acute on chronic- post op / dilutional too- transfused PRBC , , Hb has improved, 9.3  today * HTN- continue metoprolol bid, off IVF, BP has improved off IVF  * DM- on Sliding Scale Insulin, hba1c 7.5  * DVT prophylaxis, refused heparin shot  * gen weakness, PT, likley back to SNF/[facility]AC , with palliative care in  SNF    * Metabolic encephalopathy- improved overall  * ARF- ATN from sepsis, resolved, off IV fluids- monitoring closely as PO intake is poor .  * Hypernatremia- Resolved on d5 half ns , Na is 130   * stage 4 sacral ulcer, now s/p debridement and wound vac placement, following HGb periodically, transfusing as needed, bleeding has slowed down, only 25 cc over past 24 hours  Heparin subcutaneous if patient allows it  Plan of care discussed with the patient and RN. Will prefer providing sitter continuously for providing continuous feeding assistance until PEG is placed . I have discussed with the RN regarding frequent assistance with feeding              DRUG ALLERGIES: No Known Allergies  CODE STATUS: full    Code Status Orders        Start     Ordered   08/28/14 0001  Full code   Continuous     08/27/14 1412      TOTAL TIME TAKING CARE OF THIS PATIENT: 35 minutes.   Ramonita LabGouru, Chandlor Noecker M.D on 09/06/2014 at 9:07 PM  Between 7am to 6pm - Pager - [phone]  After 6pm go to [email or web address] - password [email or web address] [facility]  [facility]  Office  [phone]  CC: Primary care physician; No PCP Per Patient

## 2014-09-06 NOTE — Progress Notes (Signed)
Speech Language Pathology Treatment: Dysphagia  Patient Details Name: Ether Griffinserry Senner MRN: [id number] DOB: 10-Apr-1941 Today's Date: 09/06/2014 Time:  -     Assessment / Plan / Recommendation Clinical Impression  Pt appears to tolerate purees and thin liquids following strict aspiration precautions including positioning pt upright for oral intake and full feeding assistance. Pt appears at min. Increased risk for aspiration sec. To his declined medical status and overall weakness. Rec. Continue w/ current NDD1 diet w/ thin liquids; strict aspiration precautions; Meds in Puree - crushed as able for safer/easier swallowing; feeding assistance at meals. ST will f/u next 2-3 days for any further education on precautions.    HPI Other Pertinent Information: pt required positioning upright in bed - max. assist; pt required feeding - max. assist   Pertinent Vitals Pain Assessment: No/denies pain  SLP Plan  Continue with current plan of care    Recommendations Diet recommendations: Dysphagia 1 (puree);Thin liquid;Other(comment) (rec. full assist w/ feeding at meals; offer po's throughout the day. ) Liquids provided via: Cup;Straw Medication Administration: Crushed with puree Supervision: Full supervision/cueing for compensatory strategies;Trained caregiver to feed patient Compensations: Small sips/bites;Slow rate Postural Changes and/or Swallow Maneuvers: Seated upright 90 degrees              Oral Care Recommendations: Oral care BID Follow up Recommendations: Skilled Nursing facility Plan: Continue with current plan of care    GO     Watson,Katherine 09/06/2014, 4:33 PM

## 2014-09-06 NOTE — Consult Note (Signed)
Subjective: Patient seen for protein calorie Mal nutrition patient is not doing well with his calorie counts these being about 30% of required needs. Today he denies any problems with nausea vomiting or abdominal pain.  Objective: Vital signs in last 24 hours: Temp:  [96.6 F (35.9 C)-100.6 F (38.1 C)] 98.6 F (37 C) (05/10 0744) Pulse Rate:  [77-113] 95 (05/10 0744) Resp:  [18-20] 18 (05/10 0744) BP: (93-123)/(31-86) 93/46 mmHg (05/10 0744) SpO2:  [98 %-100 %] 100 % (05/10 0744) Blood pressure 93/46, pulse 95, temperature 98.6 F (37 C), temperature source Oral, resp. rate 18, height 6' 0.99" (1.854 m), weight 64.9 kg (143 lb 1.3 oz), SpO2 100 %.   Intake/Output from previous day: 05/09 0701 - 05/10 0700 In: 1215 [P.O.:25; I.V.:745; IV Piggyback:445] Out: 610 [Urine:575; Drains:35]  Intake/Output this shift:     General appearance:  Elderly no acute distress. Resp:  Clear to auscultation Cardio:  Regular rate and rhythm occasional skipped beat. GI:  Soft nontender bowel sounds are positive and normoactive. Ostomies and urostomy tube noted. Extremities:     Lab Results: Results for orders placed or performed during the hospital encounter of 08/20/14 (from the past 24 hour(s))  Glucose, capillary     Status: Abnormal   Collection Time: 09/05/14  9:21 PM  Result Value Ref Range   Glucose-Capillary 183 (H) 70 - 99 mg/dL  CBC     Status: Abnormal   Collection Time: 09/06/14  6:10 AM  Result Value Ref Range   WBC 5.0 3.8 - 10.6 K/uL   RBC 2.79 (L) 4.40 - 5.90 MIL/uL   Hemoglobin 7.9 (L) 13.0 - 18.0 g/dL   HCT 62.124.9 (L) 30.840.0 - 65.752.0 %   MCV 89.3 80.0 - 100.0 fL   MCH 28.4 26.0 - 34.0 pg   MCHC 31.8 (L) 32.0 - 36.0 g/dL   RDW 84.618.7 (H) 96.211.5 - 95.214.5 %   Platelets 177 150 - 440 K/uL  Glucose, capillary     Status: Abnormal   Collection Time: 09/06/14  7:46 AM  Result Value Ref Range   Glucose-Capillary 169 (H) 70 - 99 mg/dL  Glucose, capillary     Status: Abnormal   Collection Time: 09/06/14 11:46 AM  Result Value Ref Range   Glucose-Capillary 209 (H) 70 - 99 mg/dL   Comment 1 Notify RN       Recent Labs  09/04/14 0449 09/05/14 0720 09/06/14 0610  WBC 4.7 4.6 5.0  HGB 9.3* 9.0* 7.9*  HCT 28.1* 27.5* 24.9*  PLT 161 151 177   BMET  Recent Labs  09/05/14 0720  NA 143  K 3.8  CL 109  CO2 28  GLUCOSE 131*  BUN 14  CREATININE 0.70  CALCIUM 7.6*   LFT No results for input(s): PROT, ALBUMIN, AST, ALT, ALKPHOS, BILITOT, BILIDIR, IBILI in the last 72 hours. PT/INR No results for input(s): LABPROT, INR in the last 72 hours. Hepatitis Panel No results for input(s): HEPBSAG, HCVAB, HEPAIGM, HEPBIGM in the last 72 hours. C-Diff No results for input(s): CDIFFTOX in the last 72 hours. No results for input(s): CDIFFPCR in the last 72 hours.   Studies/Results: No results found.  Scheduled Inpatient Medications:   . baclofen  10 mg Oral TID  . carbamazepine  200 mg Oral BID  . ciprofloxacin  400 mg Intravenous Q12H  . collagenase   Topical Daily  . DAPTOmycin (CUBICIN)  IV  390 mg Intravenous Q24H  . diphenhydrAMINE  25 mg Intravenous Once  .  dronabinol  2.5 mg Oral BID AC  . enoxaparin (LOVENOX) injection  40 mg Subcutaneous Q24H  . feeding supplement (GLUCERNA SHAKE)  237 mL Oral BID WC  . insulin aspart  0-10 Units Subcutaneous TID AC & HS  . metoprolol tartrate  25 mg Oral BID  . metronidazole  500 mg Intravenous Q8H  . mirtazapine  15 mg Oral QHS  . potassium chloride  20 mEq Oral Daily  . sodium chloride  10-40 mL Intracatheter Q12H    Continuous Inpatient Infusions:   . sodium chloride 75 mL/hr at 09/05/14 2253    PRN Inpatient Medications:  acetaminophen, acetaminophen, LORazepam, ondansetron (ZOFRAN) IV, sodium chloride, sodium chloride  Miscellaneous:   Assessment:  1. Failure to obtain adequate caloric needs with oral feedings.  Plan:  1. We'll arrange for PEG within the next day or 2. Gen. need to discuss  further with Dr. Michela PitcherEly.  Christena DeemMartin U Dhiren Azimi MD 09/06/2014, 5:45 PM

## 2014-09-06 NOTE — Progress Notes (Signed)
CM progression: Pt and family wants to move forward with PEG placement. Gastroenterology and surgeon working on anatomical considerations due to multiple abdominal surgeries recently. An[facility] following PEG placement.

## 2014-09-07 ENCOUNTER — Inpatient Hospital Stay: Payer: Medicare PPO

## 2014-09-07 LAB — GLUCOSE, CAPILLARY
GLUCOSE-CAPILLARY: 164 mg/dL — AB (ref 70–99)
Glucose-Capillary: 139 mg/dL — ABNORMAL HIGH (ref 70–99)
Glucose-Capillary: 165 mg/dL — ABNORMAL HIGH (ref 70–99)

## 2014-09-07 LAB — CBC
HEMATOCRIT: 23.7 % — AB (ref 40.0–52.0)
Hemoglobin: 7.8 g/dL — ABNORMAL LOW (ref 13.0–18.0)
MCH: 29.6 pg (ref 26.0–34.0)
MCHC: 33 g/dL (ref 32.0–36.0)
MCV: 89.8 fL (ref 80.0–100.0)
Platelets: 172 10*3/uL (ref 150–440)
RBC: 2.64 MIL/uL — ABNORMAL LOW (ref 4.40–5.90)
RDW: 19 % — ABNORMAL HIGH (ref 11.5–14.5)
WBC: 3.7 10*3/uL — ABNORMAL LOW (ref 3.8–10.6)

## 2014-09-07 LAB — URINE CULTURE: Culture: 30000

## 2014-09-07 NOTE — Progress Notes (Signed)
Patient ID: Sean Morgan, male   DOB: July 17, 1940, 74 y.o.   MRN: [id number] [facility] [place] [facility]   PATIENT NAME: Sean Morgan    MR#:  [id number]  DATE OF BIRTH:  July 17, 1940  SUBJECTIVE:  CHIEF COMPLAINT:  Patient is with no new complaints today, okay with feeding tube , no jerky movements , GI planninng for PEG in am Patient has minimal blood in wound vac , colostomy has liquid stool, suprapubic catheter and also sacral wound debridement with wound vac 4/27, HGb is at 9.9-->9.3-->9.0-->7.9-->7.8  Today. family ok with PEG tube placement .     VITAL SIGNS: Blood pressure 114/43, pulse 92, temperature 99.9 F (37.7 C), temperature source Oral, resp. rate 16, height 6' 0.99" (1.854 m), weight 64.9 kg (143 lb 1.3 oz), SpO2 100 %.  PHYSICAL EXAMINATION:   GENERAL:  74 y.o.-year-old patient lying in the bed with no acute distress. Awake, emaciated. EYES: Pupils equal, round, reactive to light and accommodation. No scleral icterus. Extraocular muscles intact.  HEENT: Head atraumatic, normocephalic. Oropharynx and nasopharynx clear.  NECK:  Supple, no jugular venous distention. No thyroid enlargement, no tenderness.  LUNGS: Normal breath sounds bilaterally, no wheezing, rales,rhonchi or crepitation. No use of accessory muscles of respiration.  CARDIOVASCULAR: S1, S2 normal. No murmurs, rubs, or gallops.  ABDOMEN: Soft, nontender, nondistended. Bowel sounds present. No organomegaly or mass. 2 stomas noted, one more R sided contains serosanguinous fluid, more L sided contains liquidly stool, GT has been removed, suprapubic cath placed, no tenderness, swelling, bleeding, urine is draining EXTREMITIES: significant pedal edema, no cyanosis, or clubbing. Upper extremities, perineal area are swollen NEUROLOGIC: Cranial nerves II through XII are intact. Muscle strength  not able to obtain, not moving lower extremities. Sensation grossly intact in upper  body. Gait not checked. Has intermittent myoclonus today.  PSYCHIATRIC: The patient is alert and oriented x 3.  SKIN: Sacral dq ulcers with dressings  On the back, not seen  ORDERS/RESULTS REVIEWED:   CBC  Recent Labs Lab 09/02/14 0552 09/03/14 0537 09/04/14 0449 09/05/14 0720 09/06/14 0610 09/07/14 0620  WBC 5.7  --  4.7 4.6 5.0 3.7*  HGB 7.9* 9.9* 9.3* 9.0* 7.9* 7.8*  HCT 24.7* 30.5* 28.1* 27.5* 24.9* 23.7*  PLT 159  --  161 151 177 172  MCV 92.2  --  89.4 89.9 89.3 89.8  MCH 29.6  --  29.5 29.4 28.4 29.6  MCHC 32.1  --  33.0 32.7 31.8* 33.0  RDW 17.1*  --  19.3* 19.1* 18.7* 19.0*   ------------------------------------------------------------------------------------------------------------------  Chemistries   Recent Labs Lab 09/01/14 0526 09/02/14 0015 09/05/14 0720  NA 141 141 143  K  --  3.6 3.8  CL  --  110 109  CO2  --  26 28  GLUCOSE  --  167* 131*  BUN  --  12 14  CREATININE  --  0.72 0.70  CALCIUM  --  7.1* 7.6*  AST  --  21  --   ALT  --  8*  --   ALKPHOS  --  65  --   BILITOT  --  0.6  --    ------------------------------------------------------------------------------------------------------------------ estimated creatinine clearance is 72.1 mL/min (by C-G formula based on Cr of 0.7). ------------------------------------------------------------------------------------------------------------------ No results for input(s): TSH, T4TOTAL, T3FREE, THYROIDAB in the last 72 hours.  Invalid input(s): FREET3  Cardiac Enzymes No results for input(s): CKMB, TROPONINI, MYOGLOBIN in the last 168 hours.  Invalid input(s): CK ------------------------------------------------------------------------------------------------------------------ Invalid  input(s): POCBNP ---------------------------------------------------------------------------------------------------------------  RADIOLOGY: Dg Chest Port 1 View  09/07/2014   CLINICAL DATA:  PICC line placement   EXAM: PORTABLE CHEST - 1 VIEW  COMPARISON:  08/25/2014  FINDINGS: The right upper extremity PICC line extends into the upper SVC.  Lungs clear. No large effusions. Pulmonary vasculature is normal. Hilar, mediastinal and cardiac contours are unremarkable.  IMPRESSION: Right upper extremity PICC line extends to the upper SVC.   Electronically Signed   By: Ellery Plunkaniel R Mitchell M.D.   On: 09/07/2014 18:22    EKG:  Orders placed or performed during the hospital encounter of 08/20/14  . EKG 12-Lead  . EKG 12-Lead    ASSESSMENT AND PLAN:  74y/oM with High Blood Pressure (Hypertension) and DIABETES MELLITUS, [facility] for sepsis and metabolic encpehalopathy  * Dysphagia as well as moderately severe protein calorie malnutrition.  EGD in am as pt didn't meet greater than 30 % of calorie count goal . s/p  speech therapy consulted- on puree diet with thin liquids. appreciate palliative care  input, continue Marinol, family ok with PEG tube placement. continue gentle hydration, 1:1 sitter for feeding assistance while waiting for PEG placement  * Sepsis likely due to UTI, per ID- urine cultures with klebsiella, blood cultures with coagulase negative staph and sacral decub cultures with klebsiella and VRE. Off zosyn and Zyvox , now on Daptomycin iv,  cipro and flagyl per ID recommendations, needs 4-6 weeks of iv abx Appreciate surgical consult- Pt had sacral wound debridement and wound vac placed 4/27, with diverting colostomy and a suprapubic catheter placement. s/p PICC placement 4/29  chest xray with LL infiltrate-? aspiration- [facility]  * Acute depression-  Pt admits, decreased PO intake,  Psychiatry consult is pending  * Anemia- acute on chronic- post op / dilutional too- transfused PRBC , , Hb has improved, 9.3  today * HTN- continue metoprolol bid, off IVF, BP has improved off IVF  * DM- on Sliding Scale Insulin, hba1c 7.5  *  DVT prophylaxis, refused heparin shot  * gen weakness, PT, likley back to SNF/[facility] , with palliative care in SNF    * Metabolic encephalopathy- improved overall  * ARF- ATN from sepsis, resolved, off IV fluids- monitoring closely as PO intake is poor .  * Hypernatremia- Resolved on d5 half ns , Na is 130   * stage 4 sacral ulcer, now s/p debridement and wound vac placement, following HGb periodically, transfusing as needed, bleeding has slowed down, only 25 cc over past 24 hours  Heparin subcutaneous if patient allows it  Plan of care discussed with the patient and RN. Will prefer providing sitter continuously for providing continuous feeding assistance until PEG is placed . I have discussed with the RN regarding frequent assistance with feeding              DRUG ALLERGIES: No Known Allergies  CODE STATUS: full    Code Status Orders        Start     Ordered   08/28/14 0001  Full code   Continuous     08/27/14 1412      TOTAL TIME TAKING CARE OF THIS PATIENT: 35 minutes.   Ramonita LabGouru, Jazmin Vensel M.D on 09/07/2014 at 8:20 PM  Between 7am to 6pm - Pager - [phone]  After 6pm go to [email or web address] - password [email or web address] [email or web address]  [facility]  Office  [phone]  CC: Primary care  physician; No PCP Per Patient

## 2014-09-07 NOTE — Consult Note (Signed)
Subjective: Patient seen for protein calorie malnutrition and need for PEG. Patient has not done well with calorie counts. He denies any nausea vomiting or abdominal pain.  Objective: Vital signs in last 24 hours: Temp:  [98.3 F (36.8 C)-99.9 F (37.7 C)] 99.9 F (37.7 C) (05/11 1707) Pulse Rate:  [84-102] 92 (05/11 1707) Resp:  [16-18] 16 (05/11 1707) BP: (114-136)/(43-82) 114/43 mmHg (05/11 1707) SpO2:  [99 %-100 %] 100 % (05/11 1707) Blood pressure 114/43, pulse 92, temperature 99.9 F (37.7 C), temperature source Oral, resp. rate 16, height 6' 0.99" (1.854 m), weight 64.9 kg (143 lb 1.3 oz), SpO2 100 %.   Intake/Output from previous day: 05/10 0701 - 05/11 0700 In: 1059 [I.V.:521; IV Piggyback:538] Out: 585 [Urine:560; Drains:25]  Intake/Output this shift: Total I/O In: -  Out: 450 [Urine:450]   General appearance:  Elderly. 74 year old male distress Resp:  Clear to auscultation Cardio:  Regular rate and rhythm GI:  Soft nontender bowel sounds are positive, nondistended note ostomy sites and urostomy tube. Extremities:     Lab Results: Results for orders placed or performed during the hospital encounter of 08/20/14 (from the past 24 hour(s))  Glucose, capillary     Status: Abnormal   Collection Time: 09/06/14  9:04 PM  Result Value Ref Range   Glucose-Capillary 111 (H) 70 - 99 mg/dL  CBC     Status: Abnormal   Collection Time: 09/07/14  6:20 AM  Result Value Ref Range   WBC 3.7 (L) 3.8 - 10.6 K/uL   RBC 2.64 (L) 4.40 - 5.90 MIL/uL   Hemoglobin 7.8 (L) 13.0 - 18.0 g/dL   HCT 40.923.7 (L) 81.140.0 - 91.452.0 %   MCV 89.8 80.0 - 100.0 fL   MCH 29.6 26.0 - 34.0 pg   MCHC 33.0 32.0 - 36.0 g/dL   RDW 78.219.0 (H) 95.611.5 - 21.314.5 %   Platelets 172 150 - 440 K/uL  Glucose, capillary     Status: Abnormal   Collection Time: 09/07/14  7:34 AM  Result Value Ref Range   Glucose-Capillary 139 (H) 70 - 99 mg/dL   Comment 1 Notify RN   Glucose, capillary     Status: Abnormal   Collection  Time: 09/07/14 11:42 AM  Result Value Ref Range   Glucose-Capillary 164 (H) 70 - 99 mg/dL   Comment 1 Notify RN   Glucose, capillary     Status: Abnormal   Collection Time: 09/07/14  5:03 PM  Result Value Ref Range   Glucose-Capillary 165 (H) 70 - 99 mg/dL      Recent Labs  08/65/7804/01/13 0720 09/06/14 0610 09/07/14 0620  WBC 4.6 5.0 3.7*  HGB 9.0* 7.9* 7.8*  HCT 27.5* 24.9* 23.7*  PLT 151 177 172   BMET  Recent Labs  09/05/14 0720  NA 143  K 3.8  CL 109  CO2 28  GLUCOSE 131*  BUN 14  CREATININE 0.70  CALCIUM 7.6*   LFT No results for input(s): PROT, ALBUMIN, AST, ALT, ALKPHOS, BILITOT, BILIDIR, IBILI in the last 72 hours. PT/INR No results for input(s): LABPROT, INR in the last 72 hours. Hepatitis Panel No results for input(s): HEPBSAG, HCVAB, HEPAIGM, HEPBIGM in the last 72 hours. C-Diff No results for input(s): CDIFFTOX in the last 72 hours. No results for input(s): CDIFFPCR in the last 72 hours.   Studies/Results: No results found.  Scheduled Inpatient Medications:   . baclofen  10 mg Oral TID  . carbamazepine  200 mg Oral BID  .  ciprofloxacin  400 mg Intravenous Q12H  . collagenase   Topical Daily  . DAPTOmycin (CUBICIN)  IV  390 mg Intravenous Q24H  . diphenhydrAMINE  25 mg Intravenous Once  . dronabinol  2.5 mg Oral BID AC  . enoxaparin (LOVENOX) injection  40 mg Subcutaneous Q24H  . feeding supplement (GLUCERNA SHAKE)  237 mL Oral BID WC  . insulin aspart  0-10 Units Subcutaneous TID AC & HS  . metoprolol tartrate  25 mg Oral BID  . metronidazole  500 mg Intravenous Q8H  . mirtazapine  15 mg Oral QHS  . potassium chloride  20 mEq Oral Daily  . sodium chloride  10-40 mL Intracatheter Q12H    Continuous Inpatient Infusions:     PRN Inpatient Medications:  acetaminophen, acetaminophen, LORazepam, ondansetron (ZOFRAN) IV, sodium chloride, sodium chloride  Miscellaneous:   Assessment:  1. Protein calorie malnutrition. She will need PEG  placement. Consent was previously obtained from his daughter.  Plan:  1 we'll plan for PEG tomorrow. We'll need to recheck coags and hemogram in the morning.  Christena DeemMartin U Skulskie MD 09/07/2014, 5:59 PM

## 2014-09-08 ENCOUNTER — Encounter
Disposition: A | Discharge status: Skilled Nursing Facility | Payer: Self-pay | Source: Ambulatory Visit | Attending: Internal Medicine

## 2014-09-08 ENCOUNTER — Inpatient Hospital Stay: Payer: Medicare PPO | Admitting: Certified Registered Nurse Anesthetist

## 2014-09-08 LAB — CBC WITH DIFFERENTIAL/PLATELET
Basophils Absolute: 0 10*3/uL (ref 0–0.1)
Basophils Relative: 0 %
Eosinophils Absolute: 0.2 10*3/uL (ref 0–0.7)
Eosinophils Relative: 4 %
HEMATOCRIT: 23.9 % — AB (ref 40.0–52.0)
Hemoglobin: 7.7 g/dL — ABNORMAL LOW (ref 13.0–18.0)
LYMPHS ABS: 1.4 10*3/uL (ref 1.0–3.6)
LYMPHS PCT: 36 %
MCH: 29 pg (ref 26.0–34.0)
MCHC: 32.3 g/dL (ref 32.0–36.0)
MCV: 89.8 fL (ref 80.0–100.0)
MONO ABS: 0.5 10*3/uL (ref 0.2–1.0)
MONOS PCT: 14 %
Neutro Abs: 1.8 10*3/uL (ref 1.4–6.5)
Neutrophils Relative %: 46 %
Platelets: 164 10*3/uL (ref 150–440)
RBC: 2.66 MIL/uL — AB (ref 4.40–5.90)
RDW: 19.7 % — ABNORMAL HIGH (ref 11.5–14.5)
WBC: 4 10*3/uL (ref 3.8–10.6)

## 2014-09-08 LAB — GLUCOSE, CAPILLARY
GLUCOSE-CAPILLARY: 125 mg/dL — AB (ref 65–99)
GLUCOSE-CAPILLARY: 159 mg/dL — AB (ref 65–99)
GLUCOSE-CAPILLARY: 204 mg/dL — AB (ref 65–99)
Glucose-Capillary: 121 mg/dL — ABNORMAL HIGH (ref 65–99)
Glucose-Capillary: 167 mg/dL — ABNORMAL HIGH (ref 65–99)

## 2014-09-08 LAB — PROTIME-INR
INR: 1.41
Prothrombin Time: 17.5 seconds — ABNORMAL HIGH (ref 11.4–15.0)

## 2014-09-08 LAB — MRSA PCR SCREENING: MRSA by PCR: NEGATIVE

## 2014-09-08 SURGERY — EGD (ESOPHAGOGASTRODUODENOSCOPY)
Anesthesia: General

## 2014-09-08 MED ORDER — PHENYLEPHRINE HCL 10 MG/ML IJ SOLN
INTRAMUSCULAR | Status: DC | PRN
  Administered 2014-09-08: 100 ug via INTRAVENOUS
  Administered 2014-09-08: 50 ug via INTRAVENOUS

## 2014-09-08 MED ORDER — PROPOFOL INFUSION 10 MG/ML OPTIME
INTRAVENOUS | Status: DC | PRN
  Administered 2014-09-08: 75 ug/kg/min via INTRAVENOUS

## 2014-09-08 MED ORDER — PHENYLEPHRINE HCL 10 MG/ML IJ SOLN: INTRAMUSCULAR | Status: DC | PRN

## 2014-09-08 MED ORDER — FUROSEMIDE 10 MG/ML IJ SOLN
20.0000 mg | Freq: Once | INTRAMUSCULAR | Status: AC
  Administered 2014-09-08: 20 mg via INTRAVENOUS
  Filled 2014-09-08: qty 2

## 2014-09-08 MED ORDER — SODIUM CHLORIDE 0.9 % IV SOLN
INTRAVENOUS | Status: DC
  Administered 2014-09-08 – 2014-09-09 (×4): via INTRAVENOUS

## 2014-09-08 NOTE — Progress Notes (Signed)
Verbal order Dr. Shelle Ironein, hold lovenox on 09/08/2014 for peg tube placement

## 2014-09-08 NOTE — Procedures (Signed)
PEG Procedure note: ( also see Provation note)  Consent obtained.  Time out performed.   Performed PEG procedure with Dr Marva PandaSkulskie.  Upper endoscope advanced into stomach.   Excellent transillumination and finger indentation obtained.   Lidocaine injected at skin site.  Introducer catheter inserted into stomach with excellent visualization.  Wire advanced through introducer and pulled through mouth.   G-tube then pulled back through mouth and pulled through stomach wall.  External bolster applied and was lightly touching skin at 2.5 cm.  Topical bacitracin appied.   No complications.  EBL: 10 ml   Pt returned in good condition.

## 2014-09-08 NOTE — Clinical Social Work Note (Signed)
Patient to reportedly receive PEG today. CSW has updated [facility]. York SpanielMonica Cataleah Stites MSW,LCSWA [phone]

## 2014-09-08 NOTE — Anesthesia Preprocedure Evaluation (Addendum)
Anesthesia Evaluation    Airway Mallampati: II       Dental   Pulmonary former smoker,          Cardiovascular Rhythm:Regular     Neuro/Psych    GI/Hepatic   Endo/Other    Renal/GU      Musculoskeletal   Abdominal   Peds  Hematology   Anesthesia Other Findings   Reproductive/Obstetrics                            Anesthesia Physical Anesthesia Plan  ASA: III  Anesthesia Plan: General   Post-op Pain Management:    Induction:   Airway Management Planned: Nasal Cannula  Additional Equipment:   Intra-op Plan:   Post-operative Plan:   Informed Consent: I have reviewed the patients History and Physical, chart, labs and discussed the procedure including the risks, benefits and alternatives for the proposed anesthesia with the patient or authorized representative who has indicated his/her understanding and acceptance.     Plan Discussed with: CRNA and Anesthesiologist  Anesthesia Plan Comments:         Anesthesia Quick Evaluation
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
## 2014-09-08 NOTE — Transfer of Care (Signed)
Immediate Anesthesia Transfer of Care Note  Patient: Sean Morgan  Procedure(s) Performed: Procedure(s): ESOPHAGOGASTRODUODENOSCOPY (EGD) (N/A) PERCUTANEOUS ENDOSCOPIC GASTROSTOMY (PEG) PLACEMENT (N/A)  Patient Location: PACU/Endo  Anesthesia Type:General  Level of Consciousness: Alert, Awake, Oriented  Airway & Oxygen Therapy: Patient Spontanous Breathing  Post-op Assessment: Report given to RN  Post vital signs: Reviewed and stable  Last Vitals:  Filed Vitals:   09/08/14 1316  BP: 101/61  Pulse: 87  Temp: 36.2 C  Resp: 12    Complications: No apparent anesthesia complications

## 2014-09-08 NOTE — Progress Notes (Signed)
Patient ID: Sean Griffinserry Hostetler, male   DOB: Oct 21, 1940, 74 y.o.   MRN: [id number] [facility] [place] [facility]   PATIENT NAME: Sean Morgan    MR#:  [id number]  DATE OF BIRTH:  Oct 21, 1940  SUBJECTIVE:  CHIEF COMPLAINT:  Patient is pleasantly confused sometimes, reports he is in pain but doesn't want pain medication, okay with feeding tube , no jerky movements , GI planninng for PEG today Patient has minimal blood in wound vac , colostomy has liquid stool, suprapubic catheter and also sacral wound debridement with wound vac 4/27, HGb is at 9.9-->9.3-->9.0-->7.9-->7.8-->7.7  Today. family ok with PEG tube placement .   ROS- . Can't Obtain complete review of system as patient is getting intermittently confused  CONSTITUTIONAL: No fever, fatigue or weakness.   RESPIRATORY: No cough, shortness of breath, wheezing or hemoptysis.  GASTROINTESTINAL: No nausea, vomiting, diarrhea or abdominal pain.  PSYCHIATRY: admits depression.   VITAL SIGNS: Blood pressure 143/71, pulse 89, temperature 97.5 F (36.4 C), temperature source Axillary, resp. rate 17, height 6' 0.99" (1.854 m), weight 64.9 kg (143 lb 1.3 oz), SpO2 99 %.  PHYSICAL EXAMINATION:   GENERAL:  74 y.o.-year-old patient lying in the bed with no acute distress. Awake, emaciated. EYES: Pupils equal, round, reactive to light and accommodation. No scleral icterus. Extraocular muscles intact.  HEENT: Head atraumatic, normocephalic. Oropharynx and nasopharynx clear.  NECK:  Supple, no jugular venous distention. No thyroid enlargement, no tenderness.  LUNGS: Normal breath sounds bilaterally, no wheezing, rales,rhonchi or crepitation. No use of accessory muscles of respiration.  CARDIOVASCULAR: S1, S2 normal. No murmurs, rubs, or gallops.  ABDOMEN: Soft, nontender, nondistended. Bowel sounds present. No organomegaly or mass. 2 stomas noted, one more R sided contains serosanguinous fluid, more L sided contains  liquidly stool, GT has been removed, suprapubic cath placed, no tenderness, swelling, bleeding, urine is draining EXTREMITIES: significant pedal edema, no cyanosis, or clubbing. Upper extremities, perineal area are swollen NEUROLOGIC: Cranial nerves II through XII are intact. Muscle strength  not able to obtain, not moving lower extremities. Sensation grossly intact in upper body. Gait not checked. Has intermittent myoclonus today.  PSYCHIATRIC: The patient is alert and oriented x 3.  SKIN: Sacral dq ulcers with dressings  On the back, not seen  ORDERS/RESULTS REVIEWED:   CBC  Recent Labs Lab 09/04/14 0449 09/05/14 0720 09/06/14 0610 09/07/14 0620 09/08/14 0645  WBC 4.7 4.6 5.0 3.7* 4.0  HGB 9.3* 9.0* 7.9* 7.8* 7.7*  HCT 28.1* 27.5* 24.9* 23.7* 23.9*  PLT 161 151 177 172 164  MCV 89.4 89.9 89.3 89.8 89.8  MCH 29.5 29.4 28.4 29.6 29.0  MCHC 33.0 32.7 31.8* 33.0 32.3  RDW 19.3* 19.1* 18.7* 19.0* 19.7*  LYMPHSABS  --   --   --   --  1.4  MONOABS  --   --   --   --  0.5  EOSABS  --   --   --   --  0.2  BASOSABS  --   --   --   --  0.0   ------------------------------------------------------------------------------------------------------------------  Chemistries   Recent Labs Lab 09/02/14 0015 09/05/14 0720  NA 141 143  K 3.6 3.8  CL 110 109  CO2 26 28  GLUCOSE 167* 131*  BUN 12 14  CREATININE 0.72 0.70  CALCIUM 7.1* 7.6*  AST 21  --   ALT 8*  --   ALKPHOS 65  --   BILITOT 0.6  --    ------------------------------------------------------------------------------------------------------------------  estimated creatinine clearance is 72.1 mL/min (by C-G formula based on Cr of 0.7). ------------------------------------------------------------------------------------------------------------------ No results for input(s): TSH, T4TOTAL, T3FREE, THYROIDAB in the last 72 hours.  Invalid input(s): FREET3  Cardiac Enzymes No results for input(s): CKMB, TROPONINI, MYOGLOBIN  in the last 168 hours.  Invalid input(s): CK ------------------------------------------------------------------------------------------------------------------ Invalid input(s): POCBNP ---------------------------------------------------------------------------------------------------------------  RADIOLOGY: Dg Chest Port 1 View  09/07/2014   CLINICAL DATA:  PICC line placement  EXAM: PORTABLE CHEST - 1 VIEW  COMPARISON:  08/25/2014  FINDINGS: The right upper extremity PICC line extends into the upper SVC.  Lungs clear. No large effusions. Pulmonary vasculature is normal. Hilar, mediastinal and cardiac contours are unremarkable.  IMPRESSION: Right upper extremity PICC line extends to the upper SVC.   Electronically Signed   By: Ellery Plunkaniel R Mitchell M.D.   On: 09/07/2014 18:22    EKG:  Orders placed or performed during the hospital encounter of 08/20/14  . EKG 12-Lead  . EKG 12-Lead    ASSESSMENT AND PLAN:  74y/oM with High Blood Pressure (Hypertension) and DIABETES MELLITUS, [facility] and metabolic encpehalopathy  * Dysphagia as well as moderately severe protein calorie malnutrition.  EGD in am as pt didn't meet greater than 30 % of calorie count goal . s/p  speech therapy consulted- on puree diet with thin liquids. appreciate palliative care  input, continue Marinol, family ok with PEG tube placement. continue gentle hydration, scheduled for PEG placement today  * Sepsis likely due to UTI, per ID- urine cultures with klebsiella, blood cultures with coagulase negative staph and sacral decub cultures with klebsiella and VRE. Off zosyn and Zyvox , now on Daptomycin iv,  cipro and flagyl per ID recommendations, needs 4-6 weeks of iv abx Appreciate surgical consult- Pt had sacral wound debridement and wound vac placed 4/27, with diverting colostomy and a suprapubic catheter placement. s/p PICC placement 4/29  chest xray with LL infiltrate-?  aspiration- [facility]  * Acute depression-  Pt admits, decreased PO intake,  Psychiatry consult is pending  * Anemia- acute on chronic- post op / dilutional too- transfused PRBC , , Hb has improved, 9.3  today * HTN- continue metoprolol bid, off IVF, BP has improved off IVF  * DM- on Sliding Scale Insulin, hba1c 7.5  * DVT prophylaxis, refused heparin shot  * gen weakness, PT, likley back to SNF/[facility] , with palliative care in SNF    * Metabolic encephalopathy- improved overall Still noticing intermittent episodes of confusion  * ARF- ATN from sepsis, resolved, off IV fluids- monitoring closely as PO intake is poor .  * Hypernatremia- Resolved on d5 half ns , Na is 130   * stage 4 sacral ulcer, now s/p debridement and wound vac placement, following HGb periodically, transfusing as needed, bleeding has slowed down, only 25 cc over past 24 hours  Heparin subcutaneous if patient allows it  Plan of care discussed with the patient and RN.               DRUG ALLERGIES: No Known Allergies  CODE STATUS: full    Code Status Orders        Start     Ordered   08/28/14 0001  Full code   Continuous     08/27/14 1412      TOTAL TIME TAKING CARE OF THIS PATIENT: 35 minutes.   Ramonita LabGouru, Marquasia Schmieder M.D on 09/08/2014 at 2:42 PM  Between  7am to 6pm - Pager - [phone]  After 6pm go to [email or web address] - password [email or web address] [facility]  [facility]  Office  [phone]  CC: Primary care physician; No PCP Per Patient

## 2014-09-08 NOTE — Op Note (Signed)
[facility] Gastroenterology Patient Name: Sean Morgan Procedure Date: 09/08/2014 12:10 PM MRN: [id number] Account #: [id number] Date of Birth: [birth year] Admit Type: Inpatient Age: 89 [facility] Gender: Male Note Status: Finalized Procedure:         Upper GI endoscopy Indications:       Place PEG because patient is unable to eat Providers:         Christena DeemMartin U. Jaysean Manville, MD, Rhona RaiderMatthew G. Shelle Ironein, MD Medicines:         Monitored Anesthesia Care Complications:     No immediate complications. Procedure:         Pre-Anesthesia Assessment:                    - ASA Grade Assessment: III - A patient with severe                     systemic disease.                    After obtaining informed consent, the endoscope was passed                     under direct vision. Throughout the procedure, the                     patient's blood pressure, pulse, and oxygen saturations                     were monitored continuously. The Olympus GIF-160 endoscope                     (S#. [id number]) was introduced through the mouth, and                     advanced to the third part of duodenum. The upper GI                     endoscopy was accomplished without difficulty. The patient                     tolerated the procedure well. The upper GI endoscopy was                     accomplished without difficulty. Findings:      The examined esophagus was normal.      Patchy moderate inflammation characterized by congestion (edema),       erosions and shallow ulcerations was found in the gastric antrum.      Localized and patchy moderate inflammation was found in the first part       of the duodenum and in the second part of the duodenum.      The cardia and gastric fundus were normal on retroflexion.      Localization for PEG placement was done by transillumination and finger       impression. Placement of an externally removable PEG with no T-fasteners       was successfully  completed. The external bumper was at the 2.5 cm       marking on the top of the bumper. Impression:        - Normal esophagus.                    - Erosive gastritis.                    -  Erosive duodenitis.                    - An externally removable PEG placement was successfully                     completed.                    - No specimens collected. Recommendation:    - Return patient to hospital ward for ongoing care.                    - Use Protonix (pantoprazole) 40 mg IV daily daily.                    - Perform an H. pylori serology today.                    - Dietary consult.                    DO NOT USE TUBE UNTIL OK BY MD. Procedure Code(s): --- Professional ---                    [phone], Esophagogastroduodenoscopy, flexible, transoral;                     with directed placement of percutaneous gastrostomy tube Diagnosis Code(s): --- Professional ---                    535.40, Other specified gastritis, without mention of                     hemorrhage                    535.60, Duodenitis, without mention of hemorrhage                    783.3, Feeding difficulties and mismanagement                    V55.1, Attention to gastrostomy CPT copyright 2014 [employer]. All rights reserved. The codes documented in this report are preliminary and upon coder review may  be revised to meet current compliance requirements. Christena DeemMartin U Dadrian Ballantine, MD 09/08/2014 1:17:53 PM This report has been signed electronically. Kathalene FramesMatthew G Rein, MD Number of Addenda: 0 Note Initiated On: 09/08/2014 12:10 PM      [facility]

## 2014-09-09 ENCOUNTER — Encounter: Payer: Self-pay | Admitting: Internal Medicine

## 2014-09-09 LAB — CULTURE, BLOOD (SINGLE): CULTURE: NO GROWTH

## 2014-09-09 LAB — GLUCOSE, CAPILLARY
GLUCOSE-CAPILLARY: 155 mg/dL — AB (ref 65–99)
Glucose-Capillary: 136 mg/dL — ABNORMAL HIGH (ref 65–99)
Glucose-Capillary: 138 mg/dL — ABNORMAL HIGH (ref 65–99)
Glucose-Capillary: 124 mg/dL — ABNORMAL HIGH (ref 65–99)

## 2014-09-09 MED ORDER — NYSTATIN 100000 UNIT/ML MT SUSP
5.0000 mL | Freq: Four times a day (QID) | OROMUCOSAL | Status: DC
  Administered 2014-09-09 – 2014-09-13 (×14): 500000 [IU] via OROMUCOSAL
  Filled 2014-09-09 (×14): qty 5

## 2014-09-09 MED ORDER — FREE WATER
25.0000 mL | Status: DC
  Administered 2014-09-09 – 2014-09-11 (×11): 25 mL

## 2014-09-09 MED ORDER — JEVITY 1.2 CAL PO LIQD: 1000.0000 mL | ORAL | Status: DC

## 2014-09-09 MED ORDER — GLUCERNA 1.2 CAL PO LIQD: 1000.0000 mL | ORAL | Status: DC

## 2014-09-09 NOTE — Progress Notes (Signed)
PEG placed yesterday. Tube feedings have been started. GI wants to make sure tube is fully functional prior to discharge.

## 2014-09-09 NOTE — Progress Notes (Signed)
Patient ID: Sean Griffinserry Anstey, male   DOB: 06-Mar-1941, 74 y.o.   MRN: [id number] [facility] [place] [facility]   PATIENT NAME: Sean Morgan    MR#:  [id number]  DATE OF BIRTH:  06-Mar-1941  SUBJECTIVE:  CHIEF COMPLAINT:  Patient is pleasantly confused sometimes, reports he is in pain but doesn't want pain medication, had PEG yesterday, no jerky movements , GI planninng for PEG today Patient has minimal blood in wound vac , colostomy has liquid stool, suprapubic catheter and also sacral wound debridement with wound vac 4/27, HGb is at 9.9-->9.3-->9.0-->7.9-->7.8-->7.7  Today. ROS- . Can't Obtain complete review of system as patient is getting intermittently confused  CONSTITUTIONAL: No fever, fatigue or weakness.   RESPIRATORY: No cough, shortness of breath, wheezing or hemoptysis.  GASTROINTESTINAL: No nausea, vomiting, diarrhea or abdominal pain.  PSYCHIATRY: admits depression.   VITAL SIGNS: Blood pressure 148/73, pulse 103, temperature 98.7 F (37.1 C), temperature source Oral, resp. rate 18, height 6' 0.99" (1.854 m), weight 67.359 kg (148 lb 8 oz), SpO2 100 %.  PHYSICAL EXAMINATION:   GENERAL:  74 y.o.-year-old patient lying in the bed with no acute distress. Awake, emaciated. EYES: Pupils equal, round, reactive to light and accommodation. No scleral icterus. Extraocular muscles intact.  HEENT: Head atraumatic, normocephalic. Oropharynx and nasopharynx clear.  NECK:  Supple, no jugular venous distention. No thyroid enlargement, no tenderness.  LUNGS: Normal breath sounds bilaterally, no wheezing, rales,rhonchi or crepitation. No use of accessory muscles of respiration.  CARDIOVASCULAR: S1, S2 normal. No murmurs, rubs, or gallops.  ABDOMEN: Soft, nontender, nondistended. Bowel sounds present. No organomegaly or mass.  PEG tube , 2 stomas noted, one more R sided contains serosanguinous fluid, more L sided contains liquidly stool, GT has been  removed, suprapubic cath placed, no tenderness, swelling, bleeding, urine is draining EXTREMITIES: significant pedal edema, no cyanosis, or clubbing. Upper extremities, perineal area are swollen NEUROLOGIC: Cranial nerves II through XII are intact. Muscle strength  not able to obtain, not moving lower extremities. Sensation grossly intact in upper body. Gait not checked. Has intermittent myoclonus today.  PSYCHIATRIC: The patient is alert and oriented x 3.  SKIN: Sacral dq ulcers with dressings  On the back, not seen  ORDERS/RESULTS REVIEWED:   CBC  Recent Labs Lab 09/04/14 0449 09/05/14 0720 09/06/14 0610 09/07/14 0620 09/08/14 0645  WBC 4.7 4.6 5.0 3.7* 4.0  HGB 9.3* 9.0* 7.9* 7.8* 7.7*  HCT 28.1* 27.5* 24.9* 23.7* 23.9*  PLT 161 151 177 172 164  MCV 89.4 89.9 89.3 89.8 89.8  MCH 29.5 29.4 28.4 29.6 29.0  MCHC 33.0 32.7 31.8* 33.0 32.3  RDW 19.3* 19.1* 18.7* 19.0* 19.7*  LYMPHSABS  --   --   --   --  1.4  MONOABS  --   --   --   --  0.5  EOSABS  --   --   --   --  0.2  BASOSABS  --   --   --   --  0.0   ------------------------------------------------------------------------------------------------------------------  Chemistries   Recent Labs Lab 09/05/14 0720  NA 143  K 3.8  CL 109  CO2 28  GLUCOSE 131*  BUN 14  CREATININE 0.70  CALCIUM 7.6*   ------------------------------------------------------------------------------------------------------------------ estimated creatinine clearance is 74.9 mL/min (by C-G formula based on Cr of 0.7). ------------------------------------------------------------------------------------------------------------------ No results for input(s): TSH, T4TOTAL, T3FREE, THYROIDAB in the last 72 hours.  Invalid input(s): FREET3  Cardiac Enzymes No results for input(s): CKMB,  TROPONINI, MYOGLOBIN in the last 168 hours.  Invalid input(s):  CK ------------------------------------------------------------------------------------------------------------------ Invalid input(s): POCBNP ---------------------------------------------------------------------------------------------------------------  RADIOLOGY: No results found.  EKG:  Orders placed or performed during the hospital encounter of 08/20/14  . EKG 12-Lead  . EKG 12-Lead    ASSESSMENT AND PLAN:  74y/oM with High Blood Pressure (Hypertension) and DIABETES MELLITUS, [facility] and metabolic encpehalopathy  * Dysphagia as well as moderately severe protein calorie malnutrition.  PEG placed 5/12 as pt didn't meet greater than 30 % of calorie count goal . GI okayed to use PEG today and to monitor for the next 24 hrs. Started on Glucerna, s/p  speech therapy consulted- on puree diet with thin liquids. . continue gentle hydration, if tolerates PEG feeds can d/c IVF during weekend * Sepsis likely due to UTI, per ID- urine cultures with klebsiella, blood cultures with coagulase negative staph and sacral decub cultures with klebsiella and VRE. Off zosyn and Zyvox , now on Daptomycin iv,  cipro and flagyl per ID recommendations, needs 4-6 weeks of iv abx Appreciate surgical consult- Pt had sacral wound debridement and wound vac placed 4/27, with diverting colostomy and a suprapubic catheter placement. s/p PICC placement 4/29  chest xray with LL infiltrate-? aspiration- [facility]  * Acute depression-  Pt admits, decreased PO intake,  Psychiatry consult is pending, call placed 2-3 times  * Anemia- acute on chronic- post op / dilutional too- transfused PRBC , , Hb has improved, 9.3  today * HTN- continue metoprolol bid, off IVF, BP has improved off IVF  * DM- on Sliding Scale Insulin, hba1c 7.5  * DVT prophylaxis, refused heparin shot  * gen weakness, PT, likley back to SNF/[facility] , with palliative  care in SNF    * Metabolic encephalopathy- improved overall Still noticing intermittent episodes of confusion  * ARF- ATN from sepsis, resolved, off IV fluids- monitoring closely as PO intake is poor .  * Hypernatremia- Resolved on d5 half ns , Na is 130   * stage 4 sacral ulcer, now s/p debridement and wound vac placement, following HGb periodically, transfusing as needed, bleeding has slowed down, only 25 cc over past 24 hours  Heparin subcutaneous if patient allows it  Plan of care discussed with the patient and RN.               DRUG ALLERGIES: No Known Allergies  CODE STATUS: full    Code Status Orders        Start     Ordered   08/28/14 0001  Full code   Continuous     08/27/14 1412      TOTAL TIME TAKING CARE OF THIS PATIENT: 35 minutes.   Ramonita LabGouru, Jonai Weyland M.D on 09/09/2014 at 7:58 PM  Between 7am to 6pm - Pager - [phone]  After 6pm go to [email or web address] - password [email or web address] [facility]  [facility]  Office  [phone]  CC: Primary care physician; No PCP Per Patient

## 2014-09-09 NOTE — Consult Note (Signed)
  GI Inpatient Follow-up Note  Patient Identification: Sean Morgan is a 74 y.o. 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 S/P PEG placement yesterday. Asked to follow up by Dr. Marva PandaSkulskie.  Subjective: No complaints. No abdominal pain after G tube site.  Scheduled Inpatient Medications:  . baclofen  10 mg Oral TID  . carbamazepine  200 mg Oral BID  . ciprofloxacin  400 mg Intravenous Q12H  . collagenase   Topical Daily  . DAPTOmycin (CUBICIN)  IV  390 mg Intravenous Q24H  . diphenhydrAMINE  25 mg Intravenous Once  . dronabinol  2.5 mg Oral BID AC  . enoxaparin (LOVENOX) injection  40 mg Subcutaneous Q24H  . feeding supplement (GLUCERNA SHAKE)  237 mL Oral BID WC  . insulin aspart  0-10 Units Subcutaneous TID AC & HS  . metoprolol tartrate  25 mg Oral BID  . metronidazole  500 mg Intravenous Q8H  . mirtazapine  15 mg Oral QHS  . potassium chloride  20 mEq Oral Daily  . sodium chloride  10-40 mL Intracatheter Q12H    Continuous Inpatient Infusions:   . sodium chloride 125 mL/hr at 09/09/14 1154    PRN Inpatient Medications:  acetaminophen, acetaminophen, LORazepam, ondansetron (ZOFRAN) IV, sodium chloride, sodium chloride  Review of Systems: Constitutional: Weight is stable.  Eyes: No changes in vision. ENT: No oral lesions, sore throat.  GI: see HPI.  Heme/Lymph: No easy bruising.  CV: No chest pain.  GU: No hematuria.  Integumentary: No rashes.  Neuro: No headaches.  Psych: No depression/anxiety.  Endocrine: No heat/cold intolerance.  Allergic/Immunologic: No urticaria.  Resp: No cough, SOB.  Musculoskeletal: No joint swelling.    Physical Examination: BP 141/60 mmHg  Pulse 88  Temp(Src) 98.6 F (37 C) (Axillary)  Resp 18  Ht 6' 0.99" (1.854 m)  Wt 67.359 kg (148 lb 8 oz)  BMI 19.60 kg/m2  SpO2 100% Gen: NAD, alert and oriented x 4 HEENT: PEERLA, EOMI, Neck: supple, no JVD or thyromegaly Chest: CTA bilaterally, no wheezes, crackles, or other adventitious sounds CV:  RRR, no m/g/c/r Abd: soft, NT, ND, +BS in all four quadrants; no HSM, guarding, ridigity, or rebound tenderness; G site intact. No erythema. No tenderness at G site. Colostomy/ileostomy sites intact. Ext: diffuse edema, well perfused with 2+ pulses, Skin: no rash or lesions noted Lymph: no LAD  Data: Lab Results  Component Value Date   WBC 4.0 09/08/2014   HGB 7.7* 09/08/2014   HCT 23.9* 09/08/2014   MCV 89.8 09/08/2014   PLT 164 09/08/2014    Recent Labs Lab 09/06/14 0610 09/07/14 0620 09/08/14 0645  HGB 7.9* 7.8* 7.7*   Lab Results  Component Value Date   NA 143 09/05/2014   K 3.8 09/05/2014   CL 109 09/05/2014   CO2 28 09/05/2014   BUN 14 09/05/2014   CREATININE 0.70 09/05/2014   Lab Results  Component Value Date   ALT 8* 09/02/2014   AST 21 09/02/2014   ALKPHOS 65 09/02/2014   BILITOT 0.6 09/02/2014    Recent Labs Lab 09/08/14 0645  INR 1.41   Assessment/Plan: Sean Morgan is a 74 y.o. male who is s/p PEG placement. Site looks fine.  Recommendations: Ok to start TF and give meds via G tube. Make sure area is cleaned with soap/water or H2O2 daily. Follow nutritionist's recommendations. Will sign off. Please call with questions or concerns.  Kadden Osterhout, Ezzard StandingPAUL Y, MD

## 2014-09-09 NOTE — Consult Note (Deleted)
  GI Inpatient Follow-up Note  Patient Identification: Sean Morgan is a 74 y.o. male with chronic ileus. Started solids. No complaints but abdomen feels more distended. KUB not done so far.  Subjective:  Scheduled Inpatient Medications:  . baclofen  10 mg Oral TID  . carbamazepine  200 mg Oral BID  . ciprofloxacin  400 mg Intravenous Q12H  . collagenase   Topical Daily  . DAPTOmycin (CUBICIN)  IV  390 mg Intravenous Q24H  . diphenhydrAMINE  25 mg Intravenous Once  . dronabinol  2.5 mg Oral BID AC  . enoxaparin (LOVENOX) injection  40 mg Subcutaneous Q24H  . feeding supplement (GLUCERNA SHAKE)  237 mL Oral BID WC  . insulin aspart  0-10 Units Subcutaneous TID AC & HS  . metoprolol tartrate  25 mg Oral BID  . metronidazole  500 mg Intravenous Q8H  . mirtazapine  15 mg Oral QHS  . potassium chloride  20 mEq Oral Daily  . sodium chloride  10-40 mL Intracatheter Q12H    Continuous Inpatient Infusions:   . sodium chloride 125 mL/hr at 09/09/14 1154    PRN Inpatient Medications:  acetaminophen, acetaminophen, LORazepam, ondansetron (ZOFRAN) IV, sodium chloride, sodium chloride  Review of Systems: Constitutional: Weight is stable.  Eyes: No changes in vision. ENT: No oral lesions, sore throat.  GI: see HPI.  Heme/Lymph: No easy bruising.  CV: No chest pain.  GU: No hematuria.  Integumentary: No rashes.  Neuro: No headaches.  Psych: No depression/anxiety.  Endocrine: No heat/cold intolerance.  Allergic/Immunologic: No urticaria.  Resp: No cough, SOB.  Musculoskeletal: No joint swelling.    Physical Examination: BP 141/60 mmHg  Pulse 88  Temp(Src) 98.6 F (37 C) (Axillary)  Resp 18  Ht 6' 0.99" (1.854 m)  Wt 67.359 kg (148 lb 8 oz)  BMI 19.60 kg/m2  SpO2 100% Gen: NAD, alert and oriented x 4 HEENT: PEERLA, EOMI, Neck: supple, no JVD or thyromegaly Chest: CTA bilaterally, no wheezes, crackles, or other adventitious sounds CV: RRR, no m/g/c/r Abd: soft, NT,  ND, +BS in all four quadrants; no HSM, guarding, ridigity, or rebound tenderness; distended with hyperactive bowel sounds Ext: significant  edema, well perfused with 2+ pulses, Skin: no rash or lesions noted Lymph: no LAD  Data: Lab Results  Component Value Date   WBC 4.0 09/08/2014   HGB 7.7* 09/08/2014   HCT 23.9* 09/08/2014   MCV 89.8 09/08/2014   PLT 164 09/08/2014    Recent Labs Lab 09/06/14 0610 09/07/14 0620 09/08/14 0645  HGB 7.9* 7.8* 7.7*   Lab Results  Component Value Date   NA 143 09/05/2014   K 3.8 09/05/2014   CL 109 09/05/2014   CO2 28 09/05/2014   BUN 14 09/05/2014   CREATININE 0.70 09/05/2014   Lab Results  Component Value Date   ALT 8* 09/02/2014   AST 21 09/02/2014   ALKPHOS 65 09/02/2014   BILITOT 0.6 09/02/2014    Recent Labs Lab 09/08/14 0645  INR 1.41    Assessment/Plan: [name] is a 74 y.o. male with ileus.  Recommendations: Daily KUB and see ileus worsens as diet is advanced. Please call with questions or concerns.  Donette Mainwaring, Ezzard StandingPAUL Y, MD

## 2014-09-09 NOTE — Progress Notes (Signed)
Nutrition Follow-up  INTERVENTION: EN: Per MD Gouru, ok per MD Oh to start TF via PEG. Recommend Glucerna 1.2 continuous feeds with a goal rate of 1365mL/hr to provide a total of 1872kcals and 94g protein.  Will recommend free water flushes of atleast 25mL every hour once off IVF (to provide at total of 1864mL of free water daily). Will recommend checking Mg and P in the am.  Will recommend starting at 2725mL/hr and will titrate by 25mL q 12 hours once tolerating initiation. Will follow poc regarding diet order per MD recommendations, as pt able to take po diet.   NUTRITION DIAGNOSIS:  Inadequate oral intake related to acute illness as evidenced by meal completion < 50%, addressed with EN  GOAL:  Goal for tolerance of initiation of TF  MONITOR:  Digestive system Energy Intake Electrolyte and Renal Profile Glucose Profile Skin  ASSESSMENT:  Pt s/p PEG placement yesterday.  Medications: NS at 16425mL/hr, Remeron, Flagyl, KCl Labs:  Electrolyte and Renal Profile:    Recent Labs Lab 09/05/14 0720  BUN 14  CREATININE 0.70  NA 143  K 3.8   Glucose Profile:  Recent Labs  09/08/14 2114 09/09/14 0744 09/09/14 1155  GLUCAP 204* 155* 136*   Protein Profile: No results for input(s): ALBUMIN in the last 168 hours.   Height:  Ht Readings from Last 1 Encounters:  08/26/14 6' 0.99" (1.854 m)    Weight:  Wt Readings from Last 1 Encounters:  09/09/14 148 lb 8 oz (67.359 kg)    Wt Readings from Last 10 Encounters:  09/09/14 148 lb 8 oz (67.359 kg)    BMI:  Body mass index is 19.6 kg/(m^2).  Estimated Nutritional Needs:  Kcal:  1821-2153kcals, BEE: 1380kcals  Protein:  65-77g protein (1.0-1.2g/kg)  Fluid:  1623-194847mL of fluid (25-7930mL/kg)  Skin:     Diet Order:  Diet NPO time specified   Intake/Output Summary (Last 24 hours) at 09/09/14 1328 Last data filed at 09/09/14 0900  Gross per 24 hour  Intake 3005.33 ml  Output   1300 ml  Net 1705.33 ml    Last  BM:  5/12 loose stool  HIGH Care Level  Leda QuailAllyson Jaleil Renwick, RD, LDN Pager [phone]

## 2014-09-10 LAB — MAGNESIUM: Magnesium: 1.5 mg/dL — ABNORMAL LOW (ref 1.7–2.4)

## 2014-09-10 LAB — GLUCOSE, CAPILLARY
GLUCOSE-CAPILLARY: 112 mg/dL — AB (ref 65–99)
Glucose-Capillary: 117 mg/dL — ABNORMAL HIGH (ref 65–99)
Glucose-Capillary: 121 mg/dL — ABNORMAL HIGH (ref 65–99)
Glucose-Capillary: 138 mg/dL — ABNORMAL HIGH (ref 65–99)
Glucose-Capillary: 142 mg/dL — ABNORMAL HIGH (ref 65–99)
Glucose-Capillary: 172 mg/dL — ABNORMAL HIGH (ref 65–99)

## 2014-09-10 LAB — BASIC METABOLIC PANEL
Anion gap: 4 — ABNORMAL LOW (ref 5–15)
BUN: 10 mg/dL (ref 6–20)
CO2: 28 mmol/L (ref 22–32)
CREATININE: 0.62 mg/dL (ref 0.61–1.24)
Calcium: 7.5 mg/dL — ABNORMAL LOW (ref 8.9–10.3)
Chloride: 110 mmol/L (ref 101–111)
GFR calc Af Amer: >60 mL/min (ref >60)
GFR calc non Af Amer: >60 mL/min (ref >60)
Glucose, Bld: 121 mg/dL — ABNORMAL HIGH (ref 65–99)
POTASSIUM: 3.7 mmol/L (ref 3.5–5.1)
SODIUM: 142 mmol/L (ref 135–145)

## 2014-09-10 LAB — CBC
HCT: 24.8 % — ABNORMAL LOW (ref 40.0–52.0)
HEMOGLOBIN: 8 g/dL — AB (ref 13.0–18.0)
MCH: 29.1 pg (ref 26.0–34.0)
MCHC: 32.4 g/dL (ref 32.0–36.0)
MCV: 89.8 fL (ref 80.0–100.0)
Platelets: 193 10*3/uL (ref 150–440)
RBC: 2.76 MIL/uL — ABNORMAL LOW (ref 4.40–5.90)
RDW: 19.3 % — AB (ref 11.5–14.5)
WBC: 3.9 10*3/uL (ref 3.8–10.6)

## 2014-09-10 LAB — PHOSPHORUS: Phosphorus: 2.8 mg/dL (ref 2.5–4.6)

## 2014-09-10 MED ORDER — GLUCERNA 1.2 CAL PO LIQD
1000.0000 mL | ORAL | Status: DC
  Administered 2014-09-11: 1000 mL

## 2014-09-10 MED ORDER — MAGNESIUM SULFATE 4 GM/100ML IV SOLN
4.0000 g | Freq: Once | INTRAVENOUS | Status: AC
  Administered 2014-09-10: 4 g via INTRAVENOUS
  Filled 2014-09-10: qty 100

## 2014-09-10 NOTE — Plan of Care (Signed)
Problem: Consults Goal: Diagnosis-Diabetes Mellitus Outcome: Progressing New Onset Type II      

## 2014-09-10 NOTE — Progress Notes (Signed)
Patient ID: Sean Griffinserry Arias, male   DOB: 1940/12/15, 74 y.o.   MRN: [id number] Patient ID: Sean Griffinserry Melkonian, male   DOB: 1940/12/15, 74 y.o.   MRN: [id number] [facility]   PATIENT NAME: Sean Morgan    MR#:  [id number]  DATE OF BIRTH:  1940/12/15  SUBJECTIVE:  CHIEF COMPLAINT:   Feels better overall, no pain, tolerates diet po and per GT feeds being advanced to 50 ml /hour, likely to the goal of 65 ml /hour tomorrow  Less  pain in buttocks, in abdomen, colostomy has soft stool, suprapubic catheter and also sacral wound debridement with wound vac 4/27, HGb is lower, 8.0 most recently . PEG 5.12.16   VITAL SIGNS: Blood pressure 141/62, pulse 96, temperature 98 F (36.7 C), temperature source Oral, resp. rate 16, height 6' 0.99" (1.854 m), weight 67.359 kg (148 lb 8 oz), SpO2 99 %.  PHYSICAL EXAMINATION:   GENERAL:  74 y.o.-year-old patient lying in the bed with no acute distress. Some somnolent, confused, tells me he can move his arms, hands, can manipulate TV remote control, but when asked to move fingers, can not do this EYES: Pupils equal, round, reactive to light and accommodation. No scleral icterus. Extraocular muscles intact.  HEENT: Head atraumatic, normocephalic. Oropharynx and nasopharynx clear.  NECK:  Supple, no jugular venous distention. No thyroid enlargement, no tenderness.  LUNGS: Normal breath sounds bilaterally, no wheezing, rales,rhonchi or crepitation. No use of accessory muscles of respiration.  CARDIOVASCULAR: S1, S2 normal. No murmurs, rubs, or gallops.  ABDOMEN: Soft, nontender, nondistended. Bowel sounds present. No organomegaly or mass. 2 somas noted, one more R sided contains serosanguinous fluid, more L sided contains soft stool, GT has been placed, running tube feeds, , suprapubic cath placed, no tenderness, swelling, bleeding, urine is draining, dark bloody urine in the bag EXTREMITIES: significant pedal  edema, no cyanosis, or clubbing. Upper extremities, perineal area are swollen NEUROLOGIC: Cranial nerves II through XII are intact. Muscle strength  not able to obtain, not moving lower extremities. Sensation grossly intact in upper body. Gait not checked. Has intermittent myoclonus today.  PSYCHIATRIC: The patient is alert and oriented x 3.  SKIN: No obvious rash, lesion, or ulcer. Dressings  On the back, not seen  ORDERS/RESULTS REVIEWED:   CBC  Recent Labs Lab 09/05/14 0720 09/06/14 0610 09/07/14 0620 09/08/14 0645 09/10/14 0527  WBC 4.6 5.0 3.7* 4.0 3.9  HGB 9.0* 7.9* 7.8* 7.7* 8.0*  HCT 27.5* 24.9* 23.7* 23.9* 24.8*  PLT 151 177 172 164 193  MCV 89.9 89.3 89.8 89.8 89.8  MCH 29.4 28.4 29.6 29.0 29.1  MCHC 32.7 31.8* 33.0 32.3 32.4  RDW 19.1* 18.7* 19.0* 19.7* 19.3*  LYMPHSABS  --   --   --  1.4  --   MONOABS  --   --   --  0.5  --   EOSABS  --   --   --  0.2  --   BASOSABS  --   --   --  0.0  --    ------------------------------------------------------------------------------------------------------------------  Chemistries   Recent Labs Lab 09/05/14 0720 09/10/14 0527  NA 143 142  K 3.8 3.7  CL 109 110  CO2 28 28  GLUCOSE 131* 121*  BUN 14 10  CREATININE 0.70 0.62  CALCIUM 7.6* 7.5*  MG  --  1.5*   ------------------------------------------------------------------------------------------------------------------ estimated creatinine clearance is 74.9 mL/min (by C-G formula based on Cr of 0.62). ------------------------------------------------------------------------------------------------------------------ No results for  input(s): TSH, T4TOTAL, T3FREE, THYROIDAB in the last 72 hours.  Invalid input(s): FREET3  Cardiac Enzymes No results for input(s): CKMB, TROPONINI, MYOGLOBIN in the last 168 hours.  Invalid input(s): CK ------------------------------------------------------------------------------------------------------------------ Invalid input(s):  POCBNP ---------------------------------------------------------------------------------------------------------------  RADIOLOGY: No results found.  EKG:  Orders placed or performed during the hospital encounter of 08/20/14  . EKG 12-Lead  . EKG 12-Lead    ASSESSMENT AND PLAN:  74y/oM with High Blood Pressure (Hypertension) and DIABETES MELLITUS, [facility] and metabolic encpehalopathy  * Sepsis likely due to UTI, per ID- urine cultures with klebsiella, blood cultures with coagulase negative staph and sacral decub cultures with klebsiella and VRE. Off zosyn and Zyvox , now on Daptomycin iv, po cipro and flagyl per ID recommendations, needs 4-6 weeks of iv abx Appreciate surgical consult- Pt had sacral wound debridement and wound vac placed 4/27, with diverting colostomy and a suprapubic catheter placement. s/p PICC placement 4/29 , s/p GT placement 5.12.16 * aspiration pneumonitis with LLL infiltrate,  [facility] speech therapy consulted- on puree diet with thin liquids. Relatively stable, , had poor PO intake, appreciate palliative care  input, continue Marinol, calorie count done, PEG tube placed.Tube feeds being advanced Gastroenterology consult is appreciated  * Metabolic encephalopathy- improved overall, follwoing, no focal deficit  * ARF- ATN from sepsis, resolved, off IV fluids- monitoring closely as PO intake was poor, now on tube feeds .  * Hypernatremia- Resolved on d5 half ns , Na is normal recently, off IVF,  po intake is poor, also on thickened diet. Will  Need PEG for survival , PC d/w family, gastroenterology consult appreciated * Anemia- acute on chronic- post op / dilutional too- transfused PRBC , , Hb has improved, 9.3 now down to 8.0 again , may need to  transfuse packed red blood cells if trending down.  * HTN- continue metoprolol bid, off IVF, BP has improved off IVF, was again on IVf,  dc again, as now on tube feeds  * DM- on Sliding Scale Insulin, hba1c 7.5  * DVT prophylaxis, refused heparin shot  * gen weakness, PT, likley back to SNF  d/w SW todayagain, with palliative care in SNF  * Dysphagia as well as moderately severe protein calorie malnutrition. Patient had PEG tube placed by gastroenterology 5.12.16 * stage 4 sacral ulcer, now s/p debridement and wound vac placement, following HGb periodically, transfusing as needed, bleeding has slowed down, only 25 cc over past 24 hours  Heparin subcutaneous if patient allows it             DRUG ALLERGIES: No Known Allergies  CODE STATUS: full    Code Status Orders        Start     Ordered   08/28/14 0001  Full code   Continuous     08/27/14 1412      TOTAL TIME TAKING CARE OF THIS PATIENT: 40 minutes.   Katharina CaperVAICKUTE,Tequan Redmon M.D on 09/10/2014 at 12:42 PM  Between 7am to 6pm - Pager - [phone]  After 6pm go to [email or web address] - password [email or web address] [facility]  [facility]  Office  [phone]  CC: Primary care physician; No PCP Per Patient

## 2014-09-10 NOTE — Progress Notes (Signed)
Nutrition Follow-up  INTERVENTION: EN: Per MD, [place], ok to increase current TF of Glucerna 1.2 continuous to 7150ml/ hr. Goal rate of 7765ml/ hr will provide 1872 kcal, 94g Protein. Free water flushes currently at 4825ml/ q4h. IVF currently running also. Will follow POC regarding diet order per MD recommendations, as patient able to take PO diet. Meals and Snacks: Cater to patient preferences Medical Nutrition Supplement: Continue Glucerna shakes for additional nutrition   NUTRITION DIAGNOSIS:  Inadequate oral intake related to acute illness as evidenced by meal completion < 50%, addressed with EN and PO diet  GOAL:  Goal for tolerance of TF at goal rate and tolerance of PO diet  MONITOR:  Digestive system Energy Intake Electrolyte and Renal Profile Glucose Profile Skin  ASSESSMENT:  Medications: NS at 17925mL/hr, Marinol, Flagyl, Remeron Labs:  Electrolyte and Renal Profile:    Recent Labs Lab 09/05/14 0720 09/10/14 0527  BUN 14 10  CREATININE 0.70 0.62  NA 143 142  K 3.8 3.7  MG  --  1.5*  PHOS  --  2.8   Glucose Profile:  Recent Labs  09/10/14 0023 09/10/14 0414 09/10/14 0731  GLUCAP 121* 117* 112*    Height:  Ht Readings from Last 1 Encounters:  08/26/14 6' 0.99" (1.854 m)    Weight:  Wt Readings from Last 1 Encounters:  09/09/14 148 lb 8 oz (67.359 kg)    Wt Readings from Last 10 Encounters:  09/09/14 148 lb 8 oz (67.359 kg)    BMI: Body mass index is 19.6 kg/(m^2).  Estimated Nutritional Needs:  Kcal: 1821-2153kcals, BEE: 1380kcals  Protein: 65-77g protein (1.0-1.2g/kg)  Fluid: 1623-192147mL of fluid (25-7330mL/kg)  Skin:  Unstageable pressure ulcers  Diet Order:Dysphagia I with Thin Liquids   Intake/Output Summary (Last 24 hours) at 09/10/14 1002 Last data filed at 09/10/14 0737  Gross per 24 hour  Intake    214 ml  Output    825 ml  Net   -611 ml   Last BM: 5/13  Joeseph Amorracey L. Gaines, RDN Pager:  [phone] Office: [phone]  HIGH Care Level

## 2014-09-11 LAB — GLUCOSE, CAPILLARY
GLUCOSE-CAPILLARY: 202 mg/dL — AB (ref 65–99)
Glucose-Capillary: 164 mg/dL — ABNORMAL HIGH (ref 65–99)
Glucose-Capillary: 165 mg/dL — ABNORMAL HIGH (ref 65–99)
Glucose-Capillary: 169 mg/dL — ABNORMAL HIGH (ref 65–99)
Glucose-Capillary: 197 mg/dL — ABNORMAL HIGH (ref 65–99)
Glucose-Capillary: 202 mg/dL — ABNORMAL HIGH (ref 65–99)

## 2014-09-11 MED ORDER — GLUCERNA 1.2 CAL PO LIQD
1000.0000 mL | ORAL | Status: DC
  Administered 2014-09-11 – 2014-09-12 (×3): 1000 mL

## 2014-09-11 MED ORDER — FREE WATER
25.0000 mL | Status: DC
  Administered 2014-09-11 – 2014-09-13 (×56): 25 mL

## 2014-09-11 NOTE — Progress Notes (Signed)
Patient ID: Sean Griffinserry Sherwin, male   DOB: August 14, 1940, 74 y.o.   MRN: [id number] Patient ID: Sean Griffinserry Pickel, male   DOB: August 14, 1940, 74 y.o.   MRN: [id number] [facility] [place] [facility]   PATIENT NAME: Sean Morgan    MR#:  [id number]  DATE OF BIRTH:  August 14, 1940  SUBJECTIVE:  CHIEF COMPLAINT:   Feels better overall, although he is very somnolent and not able to provide much of review of systems,  no pain, tolerates diet po and per GT,  feeds being advanced to 65ml /hour, which is the goal  Less  pain in buttocks, in abdomen, colostomy has soft stool, suprapubic catheter and also sacral wound debridement with wound vac 4/27, HGb is lower, 8.0 most recently . PEG 5.12.16   VITAL SIGNS: Blood pressure 138/72, pulse 89, temperature 98.4 F (36.9 C), temperature source Oral, resp. rate 19, height 6' 0.99" (1.854 m), weight 67.359 kg (148 lb 8 oz), SpO2 97 %.  PHYSICAL EXAMINATION:   GENERAL:  74 y.o.-year-old patient lying in the bed with no acute distress. Sleepy, not much verbal EYES: Pupils equal, round, reactive to light and accommodation. No scleral icterus. Extraocular muscles intact.  HEENT: Head atraumatic, normocephalic. Oropharynx and nasopharynx clear.  NECK:  Supple, no jugular venous distention. No thyroid enlargement, no tenderness.  LUNGS: Normal breath sounds bilaterally, no wheezing, rales,rhonchi or crepitation. No use of accessory muscles of respiration.  CARDIOVASCULAR: S1, S2 normal. No murmurs, rubs, or gallops.  ABDOMEN: Soft, nontender, nondistended. Bowel sounds present. No organomegaly or mass. 2 somas noted, one more R sided contains serosanguinous fluid, more L sided contains soft stool, GT has been placed, running tube feeds, , suprapubic cath placed, no tenderness, swelling, bleeding, urine is draining, dark bloody urine in the bag EXTREMITIES: significant pedal edema, no cyanosis, or clubbing. Upper extremities, perineal area  are swollen NEUROLOGIC: Cranial nerves II through XII are intact. Muscle strength  not able to obtain, not moving lower extremities. Sensation grossly intact in upper body. Gait not checked. Has intermittent myoclonus today.  PSYCHIATRIC: The patient is alert and oriented x 3.  SKIN: No obvious rash, lesion, or ulcer. Dressings  On the back, not seen  ORDERS/RESULTS REVIEWED:   CBC  Recent Labs Lab 09/05/14 0720 09/06/14 0610 09/07/14 0620 09/08/14 0645 09/10/14 0527  WBC 4.6 5.0 3.7* 4.0 3.9  HGB 9.0* 7.9* 7.8* 7.7* 8.0*  HCT 27.5* 24.9* 23.7* 23.9* 24.8*  PLT 151 177 172 164 193  MCV 89.9 89.3 89.8 89.8 89.8  MCH 29.4 28.4 29.6 29.0 29.1  MCHC 32.7 31.8* 33.0 32.3 32.4  RDW 19.1* 18.7* 19.0* 19.7* 19.3*  LYMPHSABS  --   --   --  1.4  --   MONOABS  --   --   --  0.5  --   EOSABS  --   --   --  0.2  --   BASOSABS  --   --   --  0.0  --    ------------------------------------------------------------------------------------------------------------------  Chemistries   Recent Labs Lab 09/05/14 0720 09/10/14 0527  NA 143 142  K 3.8 3.7  CL 109 110  CO2 28 28  GLUCOSE 131* 121*  BUN 14 10  CREATININE 0.70 0.62  CALCIUM 7.6* 7.5*  MG  --  1.5*   ------------------------------------------------------------------------------------------------------------------ estimated creatinine clearance is 74.9 mL/min (by C-G formula based on Cr of 0.62). ------------------------------------------------------------------------------------------------------------------ No results for input(s): TSH, T4TOTAL, T3FREE, THYROIDAB in the last 72 hours.  Invalid input(s): FREET3  Cardiac Enzymes No results for input(s): CKMB, TROPONINI, MYOGLOBIN in the last 168 hours.  Invalid input(s): CK ------------------------------------------------------------------------------------------------------------------ Invalid input(s):  POCBNP ---------------------------------------------------------------------------------------------------------------  RADIOLOGY: No results found.  EKG:  Orders placed or performed during the hospital encounter of 08/20/14  . EKG 12-Lead  . EKG 12-Lead    ASSESSMENT AND PLAN:  74y/oM with High Blood Pressure (Hypertension) and DIABETES MELLITUS, [facility] for sepsis and metabolic encpehalopathy  * Sepsis likely due to UTI, per ID- urine cultures with klebsiella, blood cultures with coagulase negative staph and sacral decub cultures with klebsiella and VRE. Off zosyn and Zyvox , now on Daptomycin iv, po cipro and flagyl per ID recommendations, needs 4-6 weeks of iv abx Appreciate surgical consult- Pt had sacral wound debridement and wound vac placed 4/27, with diverting colostomy and a suprapubic catheter placement. s/p PICC placement 4/29 , s/p GT placement 5.12.16 * aspiration pneumonitis with LLL infiltrate,  [facility],  speech therapy consulted- on puree diet with thin liquids. Relatively stable, , had poor PO intake, appreciate palliative care  input, continue Marinol, calorie count done, PEG tube placed.Tube feeds being advanced Gastroenterology consult is appreciated  * Metabolic encephalopathy- improved overall, follwoing, no focal deficit  * ARF- ATN from sepsis, resolved, off IV fluids- monitoring closely as PO intake was poor, now on tube feeds .  * Hypernatremia- Resolved on d5 half ns , Na is normal recently, off IVF,  po intake is poor, also on thickened diet. Will  Needed G tube for survival , d/w family, gastroenterology consult appreciated * Anemia- acute on chronic- post op / dilutional too- transfused PRBC , , Hb has improved, 9.3 now down to 8.0 again , may need to  transfuse packed red blood cells if trending down. Recheck tomorrow in the morning.  * HTN- continue metoprolol bid, off IVF, BP has  improved off IVF, was again on IVf, off again, now on tube feeds  * DM- on Sliding Scale Insulin, hba1c 7.5  * DVT prophylaxis, refused heparin shot  * gen weakness, PT, likley back to SNF  d/w SW todayagain, with palliative care in SNF  * Dysphagia as well as moderately severe protein calorie malnutrition. Patient had PEG tube placed by gastroenterology 5.12.16 * INfected sacral ulcer, now s/p debridement and wound vac placement, following HGb periodically, transfusing as needed, bleeding has slowed down, only 20cc over past 24 hours  Heparin subcutaneous if patient allows it             DRUG ALLERGIES: No Known Allergies  CODE STATUS: full    Code Status Orders        Start     Ordered   08/28/14 0001  Full code   Continuous     08/27/14 1412      TOTAL TIME TAKING CARE OF THIS PATIENT: 35     min  Tyana Butzer M.D on 09/11/2014 at 1:27 PM  Between 7am to 6pm - Pager - [phone]  After 6pm go to [email or web address] - password [email or web address] [facility]  [facility] [place]  Office  [phone]  CC: Primary care physician; No PCP Per Patient

## 2014-09-11 NOTE — Progress Notes (Signed)
Nutrition Follow-up  DOCUMENTATION CODES:   INTERVENTION: EN: Per MD, [place], ok to increase current TF of Glucerna 1.2 to goal rate of 65 ml/ hr. Goal rate of 7465ml/ hr will provide 1872 kcal, 94g Protein. Free water flushes currently at 4325ml/ q4h. IVF off- per MD increase free water flushes to 25 ml/ hr. Will follow POC regarding diet order per MD recommendations, as patient able to take PO diet. Per MD, will check Mg and Phos in AM 5/16. Meals and Snacks: Cater to patient preferences Medical Nutrition Supplement: Continue Glucerna shakes for additional nutrition.  Tube feeding, Magic cup, Glucerna shake  NUTRITION DIAGNOSIS:  Inadequate oral intake related to acute illness as evidenced by meal completion < 50% continues but being addressed with PO and enteral nutrition  GOAL:  Other (Comment) (pt to eat atleast 60% of meals and atleast 2 supplements)  EN: Tolerate TF at goal rate with 24-48 hrs   MONITOR:  TF tolerance, Supplement acceptance, PO intake, Skin, Labs, Weight trends, I & O's  REASON FOR ASSESSMENT:   (RD Follow Up)    ASSESSMENT: Pt tolerating TF at 50 ml/ hr. IVF fluids off at this time.  Height:  Ht Readings from Last 1 Encounters:  08/26/14 6' 0.99" (1.854 m)    Weight:  Wt Readings from Last 1 Encounters:  09/09/14 148 lb 8 oz (67.359 kg)    Ideal Body Weight:     Wt Readings from Last 10 Encounters:  09/09/14 148 lb 8 oz (67.359 kg)    BMI:  Body mass index is 19.6 kg/(m^2).  Estimated Nutritional Needs:  Kcal:  1821-2153kcals, BEE: 1380kcals  Protein:  65-77g protein (1.0-1.2g/kg)  Fluid:  1623-193947mL of fluid (25-8230mL/kg)  Skin:  Unstageable pressure ulcers  Diet Order:  DIET - DYS 1 Room service appropriate?: Yes; Fluid consistency:: Thin  EDUCATION NEEDS:  No education needs identified at this time   Intake/Output Summary (Last 24 hours) at 09/11/14 1117 Last data filed at 09/11/14 0822  Gross per 24 hour  Intake  1884.34 ml  Output    770 ml  Net 1114.34 ml    Last BM:  5/15  Joeseph Amorracey L. Gaines, RDN Pager: [phone] Office: [phone]  HIGH Care Level

## 2014-09-12 LAB — PHOSPHORUS: Phosphorus: 2.8 mg/dL (ref 2.5–4.6)

## 2014-09-12 LAB — GLUCOSE, CAPILLARY
GLUCOSE-CAPILLARY: 201 mg/dL — AB (ref 65–99)
GLUCOSE-CAPILLARY: 154 mg/dL — AB (ref 65–99)
Glucose-Capillary: 173 mg/dL — ABNORMAL HIGH (ref 65–99)
Glucose-Capillary: 193 mg/dL — ABNORMAL HIGH (ref 65–99)

## 2014-09-12 LAB — CK: Total CK: 113 U/L (ref 49–397)

## 2014-09-12 LAB — MAGNESIUM: Magnesium: 1.9 mg/dL (ref 1.7–2.4)

## 2014-09-12 MED ORDER — CIPROFLOXACIN HCL 500 MG PO TABS
500.0000 mg | ORAL_TABLET | Freq: Two times a day (BID) | ORAL | Status: DC
  Administered 2014-09-12 – 2014-09-13 (×2): 500 mg via ORAL
  Filled 2014-09-12 (×2): qty 1

## 2014-09-12 MED ORDER — METRONIDAZOLE 500 MG PO TABS
500.0000 mg | ORAL_TABLET | Freq: Three times a day (TID) | ORAL | Status: DC
  Administered 2014-09-12 – 2014-09-13 (×2): 500 mg via ORAL
  Filled 2014-09-12 (×3): qty 1

## 2014-09-12 NOTE — Progress Notes (Addendum)
Nutrition Follow-up  DOCUMENTATION CODES:     INTERVENTION: EN: Recommend Continuing current TF regimen as pt tolerating well per Nsg this am Meals and Snacks: Cater to patient preferences  Medical Nutrition Supplement: Continue Glucerna shakes for additional nutrition.  Tube feeding, Magic cup, Glucerna shake  NUTRITION DIAGNOSIS:  Inadequate oral intake related to acute illness as evidenced by meal completion < 50% but being addressed with PO and enteral nutrition (providing 100% of estimated kcals and protein needs)  GOAL:  Other (Comment) (pt to eat atleast 60% of meals and atleast 2 supplements) EN: continue to tolerate EN at goal rate  MONITOR:  TF tolerance, Supplement acceptance, PO intake, Skin, Labs, Weight trends, I & O's  REASON FOR ASSESSMENT:   (RD Follow Up)    ASSESSMENT:  Pt continues on TF. Pt continues with wound vac to sacral wound.   PO Intake: per RN Gladstone LighterAlecia pt ate a few bites of eggs this am with sips of Glucerna shake.  Medications: Marinol, Remeron, Flagyl, Cipro, KCl Labs: Electrolyte and Renal Profile:    Recent Labs Lab 09/10/14 0527 09/12/14 0459  BUN 10  --   CREATININE 0.62  --   NA 142  --   K 3.7  --   MG 1.5* 1.9  PHOS 2.8 2.8   Glucose Profile:  Recent Labs  09/11/14 1638 09/11/14 2239 09/12/14 0856  GLUCAP 169* 165* 173*   Protein Profile: No results for input(s): ALBUMIN in the last 168 hours.   Height:  Ht Readings from Last 1 Encounters:  08/26/14 6' 0.99" (1.854 m)    Weight:  Wt Readings from Last 1 Encounters:  09/09/14 148 lb 8 oz (67.359 kg)    RD notes pt weight recorded 148lbs on 5/13, Nsg documented 'wt on scale is not level' on 5/14   Wt Readings from Last 10 Encounters:  09/09/14 148 lb 8 oz (67.359 kg)    BMI:  Body mass index is 19.6 kg/(m^2).  Estimated Nutritional Needs:  Kcal:  1821-2153kcals, BEE: 1380kcals  Protein:  65-77g protein (1.0-1.2g/kg)  Fluid:  1623-196047mL of  fluid (25-5430mL/kg)   Edema: 4+ generalized edema, RUE, LUE, and perineal  Skin:  Sacral wound with wound vac in place 4/27  Diet Order:  DIET - DYS 1 Room service appropriate?: Yes; Fluid consistency:: Thin  EDUCATION NEEDS:  No education needs identified at this time   Intake/Output Summary (Last 24 hours) at 09/12/14 1053 Last data filed at 09/12/14 0900  Gross per 24 hour  Intake 1663.25 ml  Output    850 ml  Net 813.25 ml    Last BM:  5/15 loose stool via colostomy. Per RN Alecia stool out one ostomy, sanginous fluid out of the other ostomy  MODERATE Care Level  Leda QuailAllyson Jourdyn Ferrin, RD, LDN Pager [phone]

## 2014-09-12 NOTE — Anesthesia Postprocedure Evaluation (Signed)
  Anesthesia Post-op Note  Patient: Sean Morgan  Procedure(s) Performed: Procedure(s): ESOPHAGOGASTRODUODENOSCOPY (EGD) (N/A) PERCUTANEOUS ENDOSCOPIC GASTROSTOMY (PEG) PLACEMENT (N/A)  Anesthesia type:General  Patient location: PACU  Post pain: Pain level controlled  Post assessment: Post-op Vital signs reviewed, Patient's Cardiovascular Status Stable, Respiratory Function Stable, Patent Airway and No signs of Nausea or vomiting  Post vital signs: Reviewed and stable   Level of consciousness: awake, alert  and patient cooperative  Complications: No apparent anesthesia complications

## 2014-09-12 NOTE — Care Management (Signed)
[facility] tomorrow per attending physician Dr Clelia CroftShaw.

## 2014-09-12 NOTE — Progress Notes (Signed)
[facility] [place] [facility]   PATIENT NAME: Sean Morgan    MR#:  [id number]  DATE OF BIRTH:  [birth year]  SUBJECTIVE:  CHIEF COMPLAINT:  No chief complaint on file.   REVIEW OF SYSTEMS:  Review of Systems  Unable to perform ROS: patient nonverbal    DRUG ALLERGIES:  No Known Allergies  VITALS:  Blood pressure 143/75, pulse 82, temperature 97.7 F (36.5 C), temperature source Oral, resp. rate 18, height 6' 0.99" (1.854 m), weight 67.359 kg (148 lb 8 oz), SpO2 100 %.  PHYSICAL EXAMINATION:  GENERAL:  74 y.o.-year-old patient lying in the bed with no acute distress.  EYES: Pupils equal, round, reactive to light and accommodation. No scleral icterus. Extraocular muscles intact.  HEENT: Head atraumatic, normocephalic. Oropharynx and nasopharynx clear.  NECK:  Supple, no jugular venous distention. No thyroid enlargement, no tenderness.  LUNGS: Normal breath sounds bilaterally, no wheezing, rales,rhonchi or crepitation. No use of accessory muscles of respiration.  CARDIOVASCULAR: S1, S2 normal. No murmurs, rubs, or gallops.  ABDOMEN: Soft, nontender, nondistended. Bowel sounds present. No organomegaly or mass.  EXTREMITIES: 2 Plus pedal edema, cyanosis, or clubbing.  NEUROLOGIC: Cranial nerves II through XII are intact. Muscle strength 5/5 in all extremities. Sensation intact. Gait not checked.  PSYCHIATRIC: Nonverbal.  SKIN: Sacral decubitus ulcer. PICC line in place   LABORATORY PANEL:   CBC  Recent Labs Lab 09/10/14 0527  WBC 3.9  HGB 8.0*  HCT 24.8*  PLT 193   ------------------------------------------------------------------------------------------------------------------  Chemistries   Recent Labs Lab 09/10/14 0527 09/12/14 0459  NA 142  --   K 3.7  --   CL 110  --   CO2 28  --   GLUCOSE 121*  --   BUN 10  --   CREATININE 0.62  --   CALCIUM 7.5*  --   MG 1.5* 1.9    ------------------------------------------------------------------------------------------------------------------    ASSESSMENT AND PLAN:    74y/oM with High Blood Pressure  (Hypertension) and DIABETES MELLITUS, [facility] sepsis and metabolic encpehalopathy  * Sepsis likely due to UTI, per ID- urine cultures with klebsiella, blood cultures with coagulase negative staph and sacral decub cultures with klebsiella and VRE. now on Daptomycin iv, po cipro and flagyl per ID recommendations, needs 4-6 weeks of iv abx Appreciate surgical consult- Pt had sacral wound debridement and wound vac placed 4/27, with diverting colostomy and a suprapubic catheter placement. s/p PICC placement 4/29 , s/p GT placement 5.12.16  * aspiration pneumonitis with LLL infiltrate,  [facility],  speech therapy consulted- on puree diet with thin liquids. Relatively stable, , had poor PO intake, appreciate palliative care  input, continue Marinol, calorie count done, PEG tube placed.Tube feeds being advanced Gastroenterology consult is appreciated  * Metabolic encephalopathy- improved overall, follwoing, no focal deficit  * ARF- ATN from sepsis, resolved, off IV fluids- monitoring closely as PO intake was poor, now on tube feeds .  * Hypernatremia- Resolved on d5 half ns , Na is normal recently, off IVF,  po intake is poor, also on thickened diet. Will  Needed G tube for survival , d/w family, gastroenterology consult appreciated  * Anemia- acute on chronic- post op / dilutional too- transfused PRBC , , Hb has improved, 9.3 now down to 8.0 again , may need to  transfuse packed red blood cells if trending down. Recheck tomorrow in the morning.   *  HTN- continue metoprolol bid, off IVF, BP has improved off IVF, was again on IVf, off again, now on tube feeds  * DM- on Sliding Scale Insulin, hba1c 7.5  * DVT prophylaxis, refused heparin shot  *  gen weakness, PT, likley back to SNF  d/w SW todayagain, with palliative care in SNF   * Dysphagia as well as moderately severe protein calorie malnutrition. Patient had PEG tube placed by gastroenterology 5.12.16  * INfected sacral ulcer, now s/p debridement and wound vac placement, following HGb periodically, transfusing as needed, bleeding has slowed down, only 20cc over past 24 hours  Heparin subcutaneous if patient allows it       All the records are reviewed and case discussed with Care Management/Social Workerr. Management plans discussed with the patient, family and they are in agreement.  CODE STATUS: full  TOTAL TIME TAKING CARE OF THIS PATIENT: 35 minutes.   POSSIBLE D/C IN a.m., DEPENDING ON CLINICAL CONDITION. Likely to Rehabilitation  [facility], Cristiana Yochim M.D on 09/12/2014 at 5:01 PM  Between 7am to 6pm - Pager - [phone]  After 6pm go to [email or web address] - password [email or web address] [facility]  [facility] [place] [facility]  Office  [phone]  CC: Primary care physician; No PCP Per Patient

## 2014-09-12 NOTE — Progress Notes (Signed)
Wound vac drsg changed. Pressure ulcer foam drsgs changed. Abd drsgs and ostomies x2 bags/wafers changed.

## 2014-09-12 NOTE — Clinical Social Work Note (Signed)
Informed by RN CM that the physician has stated [facility]. I have updated Teresa at [facility]. York SpanielMonica Laina Guerrieri MSW,LCSWA [phone]

## 2014-09-13 ENCOUNTER — Inpatient Hospital Stay: Payer: Medicare PPO

## 2014-09-13 DIAGNOSIS — J690 Pneumonitis due to inhalation of food and vomit: Secondary | ICD-10-CM

## 2014-09-13 DIAGNOSIS — R1314 Dysphagia, pharyngoesophageal phase: Secondary | ICD-10-CM

## 2014-09-13 DIAGNOSIS — Z9359 Other cystostomy status: Secondary | ICD-10-CM

## 2014-09-13 DIAGNOSIS — E870 Hyperosmolality and hypernatremia: Secondary | ICD-10-CM

## 2014-09-13 DIAGNOSIS — L8990 Pressure ulcer of unspecified site, unspecified stage: Secondary | ICD-10-CM

## 2014-09-13 DIAGNOSIS — A419 Sepsis, unspecified organism: Secondary | ICD-10-CM

## 2014-09-13 DIAGNOSIS — I82A12 Acute embolism and thrombosis of left axillary vein: Secondary | ICD-10-CM

## 2014-09-13 DIAGNOSIS — G253 Myoclonus: Secondary | ICD-10-CM

## 2014-09-13 DIAGNOSIS — G9341 Metabolic encephalopathy: Secondary | ICD-10-CM

## 2014-09-13 DIAGNOSIS — D62 Acute posthemorrhagic anemia: Secondary | ICD-10-CM

## 2014-09-13 DIAGNOSIS — N179 Acute kidney failure, unspecified: Secondary | ICD-10-CM

## 2014-09-13 DIAGNOSIS — Z933 Colostomy status: Secondary | ICD-10-CM

## 2014-09-13 DIAGNOSIS — M7989 Other specified soft tissue disorders: Secondary | ICD-10-CM

## 2014-09-13 DIAGNOSIS — N390 Urinary tract infection, site not specified: Secondary | ICD-10-CM

## 2014-09-13 DIAGNOSIS — L089 Local infection of the skin and subcutaneous tissue, unspecified: Secondary | ICD-10-CM

## 2014-09-13 DIAGNOSIS — R531 Weakness: Secondary | ICD-10-CM

## 2014-09-13 LAB — GLUCOSE, CAPILLARY
GLUCOSE-CAPILLARY: 165 mg/dL — AB (ref 65–99)
Glucose-Capillary: 208 mg/dL — ABNORMAL HIGH (ref 65–99)
Glucose-Capillary: 213 mg/dL — ABNORMAL HIGH (ref 65–99)

## 2014-09-13 LAB — BASIC METABOLIC PANEL
ANION GAP: 4 — AB (ref 5–15)
BUN: 13 mg/dL (ref 6–20)
CHLORIDE: 104 mmol/L (ref 101–111)
CO2: 29 mmol/L (ref 22–32)
Calcium: 7.5 mg/dL — ABNORMAL LOW (ref 8.9–10.3)
Creatinine, Ser: 0.56 mg/dL — ABNORMAL LOW (ref 0.61–1.24)
GFR calc Af Amer: >60 mL/min (ref >60)
GFR calc non Af Amer: >60 mL/min (ref >60)
Glucose, Bld: 208 mg/dL — ABNORMAL HIGH (ref 65–99)
Potassium: 4.6 mmol/L (ref 3.5–5.1)
Sodium: 137 mmol/L (ref 135–145)

## 2014-09-13 LAB — CBC
HEMATOCRIT: 25 % — AB (ref 40.0–52.0)
HEMOGLOBIN: 8.2 g/dL — AB (ref 13.0–18.0)
MCH: 29.3 pg (ref 26.0–34.0)
MCHC: 32.7 g/dL (ref 32.0–36.0)
MCV: 89.7 fL (ref 80.0–100.0)
Platelets: 235 10*3/uL (ref 150–440)
RBC: 2.79 MIL/uL — ABNORMAL LOW (ref 4.40–5.90)
RDW: 19.5 % — ABNORMAL HIGH (ref 11.5–14.5)
WBC: 5.3 10*3/uL (ref 3.8–10.6)

## 2014-09-13 MED ORDER — LORAZEPAM 2 MG/ML IJ SOLN: 2.0000 mg | INTRAMUSCULAR | Status: DC | PRN

## 2014-09-13 MED ORDER — SODIUM CHLORIDE 0.9 % IV SOLN: 390.0000 mg | INTRAVENOUS | Status: DC

## 2014-09-13 MED ORDER — OXYCODONE-ACETAMINOPHEN 5-325 MG PO TABS
2.0000 | ORAL_TABLET | ORAL | Status: DC | PRN
Start: 2014-09-13 — End: 2014-09-29

## 2014-09-13 MED ORDER — GLUCERNA SHAKE PO LIQD: 237.0000 mL | Freq: Two times a day (BID) | ORAL | Status: DC

## 2014-09-13 MED ORDER — CARBAMAZEPINE 200 MG PO TABS: 200.0000 mg | ORAL_TABLET | Freq: Two times a day (BID) | ORAL | Status: AC

## 2014-09-13 MED ORDER — APIXABAN 5 MG PO TABS: 10.0000 mg | ORAL_TABLET | Freq: Two times a day (BID) | ORAL | Status: DC

## 2014-09-13 MED ORDER — CIPROFLOXACIN HCL 500 MG PO TABS: 500.0000 mg | ORAL_TABLET | Freq: Two times a day (BID) | ORAL | Status: DC

## 2014-09-13 MED ORDER — GLUCERNA 1.2 CAL PO LIQD: 1000.0000 mL | ORAL | Status: DC

## 2014-09-13 MED ORDER — DRONABINOL 2.5 MG PO CAPS: 2.5000 mg | ORAL_CAPSULE | Freq: Two times a day (BID) | ORAL | Status: DC

## 2014-09-13 MED ORDER — METOPROLOL TARTRATE 25 MG PO TABS: 25.0000 mg | ORAL_TABLET | Freq: Two times a day (BID) | ORAL | Status: DC

## 2014-09-13 MED ORDER — NYSTATIN 100000 UNIT/ML MT SUSP: 5.0000 mL | Freq: Four times a day (QID) | OROMUCOSAL | Status: DC

## 2014-09-13 MED ORDER — METRONIDAZOLE 500 MG PO TABS: 500.0000 mg | ORAL_TABLET | Freq: Three times a day (TID) | ORAL | Status: DC

## 2014-09-13 MED ORDER — INSULIN ASPART 100 UNIT/ML ~~LOC~~ SOLN: 0.0000 [IU] | Freq: Three times a day (TID) | SUBCUTANEOUS | Status: DC

## 2014-09-13 MED ORDER — MIRTAZAPINE 15 MG PO TABS: 15.0000 mg | ORAL_TABLET | Freq: Every day | ORAL | Status: AC

## 2014-09-13 MED ORDER — FREE WATER: 25.0000 mL | Status: DC

## 2014-09-13 NOTE — Clinical Social Work Note (Signed)
Physician is ready to discharge patient today [facility] CSW has notified patient's daughter, Delice Bisonara, and she verbalized agreement. [facility] has been aware for some time that patient will require a wound vac, intravenous antibiotics, has new colostomy, and new feeding tube.  York SpanielMonica Halston Kintz MSW,LCSWA [phone]

## 2014-09-13 NOTE — Discharge Summary (Addendum)
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 occlusive thrombus within the left axillary vein (images 19 through 21).  Cephalic Vein: No evidence of thrombus. Normal compressibility, respiratory phasicity and response  to augmentation.  Basilic Vein: There is slightly expansile largely occlusive thrombus within the basilic vein (images 22 through 24 and images 32 through 34).)  Brachial Veins: There is hypoechoic occlusive thrombus within 1 of the paired left brachial veins (representative images 25 through 27). The other paired brachial vein appears widely patent.  Radial Veins: No evidence of thrombus. Normal compressibility, respiratory phasicity and response to augmentation.  Ulnar Veins: No evidence of thrombus. Normal compressibility, respiratory phasicity and response to augmentation.  Venous Reflux:  None visualized.  Other Findings: Subcutaneous edema is noted at the level of the forearm.   IMPRESSION: 1. Examination is positive for occlusive DVT extending from the left axillary vein into one of the paired brachial veins. The left subclavian vein appears patent were imaged. 2. Examination is positive for superficial thrombophlebitis involving the left basilic vein.   Electronically Signed   By: Simonne ComeJohn  Watts M.D.   On: 09/13/2014 17:33    EKG:   Orders placed or performed during the hospital encounter of 08/20/14  . EKG 12-Lead  . EKG 12-Lead      Management plans discussed with the patient, family and they are in agreement.  CODE STATUS:     Code Status Orders        Start     Ordered   08/28/14 0001  Full code   Continuous     08/27/14 1412      TOTAL TIME TAKING CARE OF THIS PATIENT: 45 minutes.    Katharina CaperVAICKUTE,Kioni Stahl M.D on 09/13/2014 at 6:17 PM  Between 7am to 6pm - Pager - [phone]  After 6pm go to [email or web address] - password [email or web address] [facility]  [facility]  Office  [phone]  CC: Primary care physician; No PCP Per Patient

## 2014-09-13 NOTE — Discharge Instructions (Signed)
Catheter-Associated Bloodstream Infections FAQs WHAT IS A CATHETER-ASSOCIATED BLOODSTREAM INFECTION?  A "central line" or "central catheter" is a tube that is placed into a patient's large vein, usually in the neck, chest, arm, or groin. The catheter is often used to draw blood, or give fluids or medications. It may be left in place for several weeks. A bloodstream infection can occur when bacteria or other germs travel down a "central line" and enter the blood. If you develop a catheter-associated bloodstream infection you may become ill with fevers and chills or the skin around the catheter may become sore and red. CAN A CATHETER-RELATED BLOODSTREAM INFECTION BE TREATED? A catheter-associated bloodstream infection is serious, but often can be successfully treated with antibiotics. The catheter might need to be removed if you develop an infection. WHAT ARE SOME OF THE THINGS THAT HOSPITALS ARE DOING TO PREVENT CATHETER-ASSOCIATED BLOODSTREAM INFECTIONS? To prevent catheter-associated bloodstream infections doctors and nurses will:  Choose a vein where the catheter can be safely inserted and where the risk for infection is small.  Clean their hands with soap and water or an alcohol-based hand rub before putting in the catheter.  Wear a mask, cap, sterile gown, and sterile gloves when putting in the catheter to keep it sterile. The patient will be covered with a sterile sheet.  Clean the patient's skin with an antiseptic cleanser before putting in the catheter.  Clean their hands, wear gloves, and clean the catheter opening with an antiseptic solution before using the catheter to draw blood or give medications. Healthcare providers also clean their hands and wear gloves when changing the bandage that covers the area where the catheter enters the skin.  Decide every day if the patient still needs to have the catheter. The catheter will be removed as soon as it is no longer needed.  Carefully handle  medications and fluids that are given through the catheter. WHAT CAN I DO TO HELP PREVENT A CATHETER-ASSOCIATED BLOODSTREAM INFECTION?   Ask your doctors and nurses to explain why you need the catheter and how long you will have it.  Ask your doctors and nurses if they will be using all of the prevention methods discussed above.  Make sure that all doctors and nurses caring for you clean their hands with soap and water or an alcohol-based hand rub before and after caring for you.  If you do not see your providers clean their hands, please ask them to do so.  If the bandage comes off or becomes wet or dirty, tell your nurse or doctor immediately.  Inform your nurse or doctor if the area around your catheter is sore or red.  Do not let family and friends who visit touch the catheter or the tubing.  Make sure family and friends clean their hands with soap and water or an alcohol-based hand rub before and after visiting you. WHAT DO I NEED TO DO WHEN I [place]? Some patients are sent home from the hospital with a catheter in order to continue their treatment. If you go home with a catheter, your doctors and nurses will explain everything you need to know about taking care of your catheter.  Make sure you understand how to care for the catheter before leaving the hospital. For example, ask for instructions on showering or bathing with the catheter and how to change the catheter dressing.  Make sure you know who to contact if you have questions or problems after you get home.  Make sure you wash your hands with soap and water or an alcohol-based hand rub before handling your catheter.  Watch for the signs and symptoms of catheter-associated bloodstream infection, such as soreness or redness at the catheter site or fever, and call your healthcare provider immediately if any occur. If you have questions, please ask your doctor or nurse. Developed and co-sponsored by [employer]  for [facility] [employer] [facility] [place] [phone](SHEA); Infectious Diseases Society of America (IDSA); [facility] Association; Association for Professionals in Infection Control and Epidemiology (APIC); Center for Disease Control (CDC); and The Joint Commission Document Released: 08/10/2010 Document Revised: 01/08/2012 Document Reviewed: 06/21/2013 [facility] Patient Information 2015 [employer] [place] [place]. This information is not intended to replace advice given to you by your health care provider. Make sure you discuss any questions you have with your health care provider.

## 2014-09-13 NOTE — Progress Notes (Signed)
[facility].  Patient transported via EMS. Incontinence care provided prior to transport.  Wound vac and feeding tube disconnected. No acute distress noted the time of discharge.

## 2014-09-13 NOTE — Progress Notes (Signed)
Inpatient Diabetes Program Recommendations  AACE/ADA: New Consensus Statement on Inpatient Glycemic Control (2013)  Target Ranges:  Prepandial:   less than 140 mg/dL      Peak postprandial:   less than 180 mg/dL (1-2 hours)      Critically ill patients:  140 - 180 mg/dL   Results for Sean GriffinsLETTERLOUGH, Sean Morgan (MRN [id number]) as of 09/13/2014 09:25  Ref. Range 09/12/2014 08:56 09/12/2014 13:59 09/12/2014 16:20 09/12/2014 21:41 09/13/2014 07:59  Glucose-Capillary Latest Ref Range: 65-99 mg/dL 119173 (H) 147201 (H) 829193 (H) 154 (H) 208 (H)   Reason for assessment: elevated CBG  Diabetes history: Type 2 Outpatient Diabetes medications: Tradjenta 5mg /day, Glucotrol 5mg  bid Current orders for Inpatient glycemic control: Novolog correction 0-10 tid and hs  Blood sugars above ADA guidelines.  To promote healing, please consider adding basal insulin, Lantus 7 units q day (0.1units/kg).   Susette RacerJulie Yun Gutierrez, RN, BA, MHA, CDE Diabetes Coordinator Inpatient Diabetes Program  [phone] (Team Pager) [phone] [facility] Office) 09/13/2014 9:26 AM

## 2014-09-16 ENCOUNTER — Other Ambulatory Visit
Admission: RE | Admit: 2014-09-16 | Discharge: 2014-09-16 | Disposition: A | Discharge status: Home / Self Care | Payer: Medicare PPO | Source: Ambulatory Visit | Attending: Internal Medicine | Admitting: Internal Medicine

## 2014-09-16 DIAGNOSIS — M869 Osteomyelitis, unspecified: Secondary | ICD-10-CM | POA: Insufficient documentation

## 2014-09-16 LAB — LIPID PANEL
Cholesterol: 163 mg/dL (ref 0–200)
HDL: 24 mg/dL — AB (ref >40)
LDL CALC: 109 mg/dL — AB (ref 0–99)
Total CHOL/HDL Ratio: 6.8 RATIO
Triglycerides: 149 mg/dL (ref <150)
VLDL: 30 mg/dL (ref 0–40)

## 2014-09-16 LAB — COMPREHENSIVE METABOLIC PANEL
ALBUMIN: 1.5 g/dL — AB (ref 3.5–5.0)
ALK PHOS: 111 U/L (ref 38–126)
ALT: 13 U/L — ABNORMAL LOW (ref 17–63)
AST: 33 U/L (ref 15–41)
Anion gap: 8 (ref 5–15)
BILIRUBIN TOTAL: 0.2 mg/dL — AB (ref 0.3–1.2)
BUN: 13 mg/dL (ref 6–20)
CHLORIDE: 99 mmol/L — AB (ref 101–111)
CO2: 30 mmol/L (ref 22–32)
Calcium: 7.6 mg/dL — ABNORMAL LOW (ref 8.9–10.3)
Creatinine, Ser: 0.66 mg/dL (ref 0.61–1.24)
GFR calc Af Amer: >60 mL/min (ref >60)
GFR calc non Af Amer: >60 mL/min (ref >60)
GLUCOSE: 150 mg/dL — AB (ref 65–99)
POTASSIUM: 4.7 mmol/L (ref 3.5–5.1)
Sodium: 137 mmol/L (ref 135–145)
Total Protein: 4.5 g/dL — ABNORMAL LOW (ref 6.5–8.1)

## 2014-09-16 LAB — CBC WITH DIFFERENTIAL/PLATELET
Basophils Absolute: 0.1 10*3/uL (ref 0–0.1)
Basophils Relative: 1 %
EOS ABS: 0.2 10*3/uL (ref 0–0.7)
EOS PCT: 2 %
HEMATOCRIT: 27.5 % — AB (ref 40.0–52.0)
HEMOGLOBIN: 8.9 g/dL — AB (ref 13.0–18.0)
LYMPHS ABS: 1.8 10*3/uL (ref 1.0–3.6)
Lymphocytes Relative: 22 %
MCH: 28.9 pg (ref 26.0–34.0)
MCHC: 32.2 g/dL (ref 32.0–36.0)
MCV: 89.6 fL (ref 80.0–100.0)
Monocytes Absolute: 0.9 10*3/uL (ref 0.2–1.0)
Monocytes Relative: 12 %
NEUTROS ABS: 5 10*3/uL (ref 1.4–6.5)
NEUTROS PCT: 63 %
PLATELETS: 348 10*3/uL (ref 150–440)
RBC: 3.07 MIL/uL — ABNORMAL LOW (ref 4.40–5.90)
RDW: 19.6 % — ABNORMAL HIGH (ref 11.5–14.5)
WBC: 7.9 10*3/uL (ref 3.8–10.6)

## 2014-09-16 LAB — PSA: PSA: 0.68 ng/mL (ref 0.00–4.00)

## 2014-09-16 LAB — C-REACTIVE PROTEIN: CRP: 17.8 mg/dL — AB (ref <1.0)

## 2014-09-16 LAB — HEMOGLOBIN A1C: Hgb A1c MFr Bld: 6.7 % — ABNORMAL HIGH (ref 4.0–6.0)

## 2014-09-16 LAB — CK: CK TOTAL: 192 U/L (ref 49–397)

## 2014-09-26 ENCOUNTER — Inpatient Hospital Stay
Admission: EM | Admit: 2014-09-26 | Discharge: 2014-09-29 | DRG: 871 | Payer: Medicare PPO | Attending: Internal Medicine | Admitting: Internal Medicine

## 2014-09-26 ENCOUNTER — Emergency Department: Payer: No Typology Code available for payment source | Attending: Emergency Medicine

## 2014-09-26 DIAGNOSIS — E860 Dehydration: Secondary | ICD-10-CM | POA: Diagnosis present

## 2014-09-26 DIAGNOSIS — E119 Type 2 diabetes mellitus without complications: Secondary | ICD-10-CM | POA: Diagnosis present

## 2014-09-26 DIAGNOSIS — G253 Myoclonus: Secondary | ICD-10-CM | POA: Diagnosis present

## 2014-09-26 DIAGNOSIS — G8250 Quadriplegia, unspecified: Secondary | ICD-10-CM | POA: Diagnosis present

## 2014-09-26 DIAGNOSIS — E872 Acidosis, unspecified: Secondary | ICD-10-CM | POA: Diagnosis present

## 2014-09-26 DIAGNOSIS — N390 Urinary tract infection, site not specified: Secondary | ICD-10-CM | POA: Diagnosis present

## 2014-09-26 DIAGNOSIS — Z66 Do not resuscitate: Secondary | ICD-10-CM | POA: Diagnosis present

## 2014-09-26 DIAGNOSIS — I959 Hypotension, unspecified: Secondary | ICD-10-CM | POA: Diagnosis present

## 2014-09-26 DIAGNOSIS — G8220 Paraplegia, unspecified: Secondary | ICD-10-CM | POA: Diagnosis present

## 2014-09-26 DIAGNOSIS — E46 Unspecified protein-calorie malnutrition: Secondary | ICD-10-CM | POA: Diagnosis present

## 2014-09-26 DIAGNOSIS — T83511A Infection and inflammatory reaction due to indwelling urethral catheter, initial encounter: Secondary | ICD-10-CM

## 2014-09-26 DIAGNOSIS — E785 Hyperlipidemia, unspecified: Secondary | ICD-10-CM | POA: Diagnosis present

## 2014-09-26 DIAGNOSIS — Z794 Long term (current) use of insulin: Secondary | ICD-10-CM | POA: Diagnosis not present

## 2014-09-26 DIAGNOSIS — Z87891 Personal history of nicotine dependence: Secondary | ICD-10-CM | POA: Diagnosis not present

## 2014-09-26 DIAGNOSIS — Z79899 Other long term (current) drug therapy: Secondary | ICD-10-CM | POA: Diagnosis not present

## 2014-09-26 DIAGNOSIS — L8990 Pressure ulcer of unspecified site, unspecified stage: Secondary | ICD-10-CM | POA: Insufficient documentation

## 2014-09-26 DIAGNOSIS — Z515 Encounter for palliative care: Secondary | ICD-10-CM | POA: Diagnosis not present

## 2014-09-26 DIAGNOSIS — Z79891 Long term (current) use of opiate analgesic: Secondary | ICD-10-CM

## 2014-09-26 DIAGNOSIS — A419 Sepsis, unspecified organism: Secondary | ICD-10-CM | POA: Diagnosis not present

## 2014-09-26 DIAGNOSIS — A4152 Sepsis due to Pseudomonas: Secondary | ICD-10-CM | POA: Diagnosis present

## 2014-09-26 DIAGNOSIS — Z86718 Personal history of other venous thrombosis and embolism: Secondary | ICD-10-CM | POA: Diagnosis not present

## 2014-09-26 DIAGNOSIS — N400 Enlarged prostate without lower urinary tract symptoms: Secondary | ICD-10-CM | POA: Diagnosis present

## 2014-09-26 DIAGNOSIS — Z8673 Personal history of transient ischemic attack (TIA), and cerebral infarction without residual deficits: Secondary | ICD-10-CM

## 2014-09-26 DIAGNOSIS — D649 Anemia, unspecified: Secondary | ICD-10-CM | POA: Diagnosis present

## 2014-09-26 DIAGNOSIS — I82621 Acute embolism and thrombosis of deep veins of right upper extremity: Secondary | ICD-10-CM | POA: Diagnosis not present

## 2014-09-26 DIAGNOSIS — F22 Delusional disorders: Secondary | ICD-10-CM | POA: Diagnosis present

## 2014-09-26 DIAGNOSIS — R609 Edema, unspecified: Secondary | ICD-10-CM

## 2014-09-26 DIAGNOSIS — E871 Hypo-osmolality and hyponatremia: Secondary | ICD-10-CM | POA: Diagnosis present

## 2014-09-26 DIAGNOSIS — L89154 Pressure ulcer of sacral region, stage 4: Secondary | ICD-10-CM | POA: Diagnosis present

## 2014-09-26 HISTORY — DX: Benign prostatic hyperplasia without lower urinary tract symptoms: N40.0

## 2014-09-26 HISTORY — DX: Cerebral infarction, unspecified: I63.9

## 2014-09-26 HISTORY — DX: Pressure ulcer of unspecified site, stage 4: L89.94

## 2014-09-26 HISTORY — DX: Local infection of the skin and subcutaneous tissue, unspecified: L08.9

## 2014-09-26 HISTORY — DX: Deep phlebothrombosis in pregnancy, unspecified trimester: O22.30

## 2014-09-26 HISTORY — DX: Pneumonitis due to inhalation of food and vomit: J69.0

## 2014-09-26 HISTORY — DX: Quadriplegia, unspecified: G82.50

## 2014-09-26 HISTORY — DX: Type 2 diabetes mellitus without complications: E11.9

## 2014-09-26 HISTORY — DX: Anemia, unspecified: D64.9

## 2014-09-26 HISTORY — DX: Essential (primary) hypertension: I10

## 2014-09-26 LAB — CBC WITH DIFFERENTIAL/PLATELET
Basophils Absolute: 0 10*3/uL (ref 0–0.1)
Basophils Relative: 0 %
Eosinophils Absolute: 0.1 10*3/uL (ref 0–0.7)
Eosinophils Relative: 1 %
HCT: 17.9 % — ABNORMAL LOW (ref 40.0–52.0)
Hemoglobin: 5.7 g/dL — ABNORMAL LOW (ref 13.0–18.0)
LYMPHS ABS: 1.3 10*3/uL (ref 1.0–3.6)
Lymphocytes Relative: 21 %
MCH: 28.4 pg (ref 26.0–34.0)
MCHC: 31.6 g/dL — AB (ref 32.0–36.0)
MCV: 89.9 fL (ref 80.0–100.0)
MONOS PCT: 13 %
Monocytes Absolute: 0.8 10*3/uL (ref 0.2–1.0)
NEUTROS ABS: 4.1 10*3/uL (ref 1.4–6.5)
NEUTROS PCT: 65 %
Platelets: 319 10*3/uL (ref 150–440)
RBC: 1.99 MIL/uL — AB (ref 4.40–5.90)
RDW: 20.4 % — AB (ref 11.5–14.5)
WBC: 6.4 10*3/uL (ref 3.8–10.6)

## 2014-09-26 LAB — URINALYSIS COMPLETE WITH MICROSCOPIC (ARMC ONLY)
Bilirubin Urine: NEGATIVE
Glucose, UA: NEGATIVE mg/dL
KETONES UR: NEGATIVE mg/dL
NITRITE: NEGATIVE
PROTEIN: 30 mg/dL — AB
SPECIFIC GRAVITY, URINE: 1.018 (ref 1.005–1.030)
pH: 6 (ref 5.0–8.0)

## 2014-09-26 LAB — PREPARE RBC (CROSSMATCH)

## 2014-09-26 LAB — COMPREHENSIVE METABOLIC PANEL
ALT: 18 U/L (ref 17–63)
AST: 50 U/L — AB (ref 15–41)
Albumin: 1.4 g/dL — ABNORMAL LOW (ref 3.5–5.0)
Alkaline Phosphatase: 293 U/L — ABNORMAL HIGH (ref 38–126)
Anion gap: 9 (ref 5–15)
BILIRUBIN TOTAL: 0.7 mg/dL (ref 0.3–1.2)
BUN: 36 mg/dL — ABNORMAL HIGH (ref 6–20)
CO2: 23 mmol/L (ref 22–32)
Calcium: 6.8 mg/dL — ABNORMAL LOW (ref 8.9–10.3)
Chloride: 95 mmol/L — ABNORMAL LOW (ref 101–111)
Creatinine, Ser: 0.87 mg/dL (ref 0.61–1.24)
GFR calc Af Amer: >60 mL/min (ref >60)
Glucose, Bld: 178 mg/dL — ABNORMAL HIGH (ref 65–99)
Potassium: 5.1 mmol/L (ref 3.5–5.1)
SODIUM: 127 mmol/L — AB (ref 135–145)
TOTAL PROTEIN: 5 g/dL — AB (ref 6.5–8.1)

## 2014-09-26 LAB — GLUCOSE, CAPILLARY: Glucose-Capillary: 176 mg/dL — ABNORMAL HIGH (ref 65–99)

## 2014-09-26 LAB — LACTIC ACID, PLASMA: Lactic Acid, Venous: 2.5 mmol/L (ref 0.5–2.0)

## 2014-09-26 LAB — MAGNESIUM: MAGNESIUM: 2.4 mg/dL (ref 1.7–2.4)

## 2014-09-26 LAB — TROPONIN I: TROPONIN I: 0.05 ng/mL — AB (ref <0.031)

## 2014-09-26 MED ORDER — SODIUM CHLORIDE 0.9 % IV SOLN
1000.0000 mL | Freq: Once | INTRAVENOUS | Status: AC
  Administered 2014-09-26: 1000 mL via INTRAVENOUS

## 2014-09-26 MED ORDER — MEGESTROL ACETATE 400 MG/10ML PO SUSP
625.0000 mg | Freq: Every day | ORAL | Status: DC
  Administered 2014-09-27 – 2014-09-29 (×3): 625 mg via ORAL
  Filled 2014-09-26 (×4): qty 20

## 2014-09-26 MED ORDER — ONDANSETRON HCL 4 MG PO TABS: 4.0000 mg | ORAL_TABLET | Freq: Four times a day (QID) | ORAL | Status: DC | PRN

## 2014-09-26 MED ORDER — NYSTATIN 100000 UNIT/ML MT SUSP
5.0000 mL | Freq: Four times a day (QID) | OROMUCOSAL | Status: DC
  Administered 2014-09-26 – 2014-09-29 (×9): 500000 [IU] via OROMUCOSAL
  Filled 2014-09-26 (×9): qty 5

## 2014-09-26 MED ORDER — CARBAMAZEPINE 200 MG PO TABS
200.0000 mg | ORAL_TABLET | Freq: Two times a day (BID) | ORAL | Status: DC
  Administered 2014-09-26 – 2014-09-29 (×6): 200 mg via ORAL
  Filled 2014-09-26 (×6): qty 1

## 2014-09-26 MED ORDER — VANCOMYCIN HCL 10 G IV SOLR
1250.0000 mg | Freq: Two times a day (BID) | INTRAVENOUS | Status: DC
  Administered 2014-09-27: 1250 mg via INTRAVENOUS
  Filled 2014-09-26 (×3): qty 1250

## 2014-09-26 MED ORDER — ZINC SULFATE 220 (50 ZN) MG PO CAPS
220.0000 mg | ORAL_CAPSULE | Freq: Every day | ORAL | Status: DC
Start: 2014-09-26 — End: 2014-09-29
  Administered 2014-09-27 – 2014-09-29 (×3): 220 mg via ORAL
  Filled 2014-09-26 (×4): qty 1

## 2014-09-26 MED ORDER — ALBUTEROL SULFATE (2.5 MG/3ML) 0.083% IN NEBU: 2.5000 mg | INHALATION_SOLUTION | RESPIRATORY_TRACT | Status: DC | PRN

## 2014-09-26 MED ORDER — PIPERACILLIN-TAZOBACTAM 3.375 G IVPB
3.3750 g | Freq: Once | INTRAVENOUS | Status: AC
  Administered 2014-09-26: 3.375 g via INTRAVENOUS

## 2014-09-26 MED ORDER — VANCOMYCIN HCL 10 G IV SOLR
1000.0000 mg | Freq: Once | INTRAVENOUS | Status: AC
  Administered 2014-09-26: 1000 mg via INTRAVENOUS
  Filled 2014-09-26: qty 1000

## 2014-09-26 MED ORDER — MIRTAZAPINE 15 MG PO TABS
15.0000 mg | ORAL_TABLET | Freq: Every day | ORAL | Status: DC
  Administered 2014-09-26 – 2014-09-28 (×3): 15 mg via ORAL
  Filled 2014-09-26 (×3): qty 1

## 2014-09-26 MED ORDER — PIPERACILLIN-TAZOBACTAM 3.375 G IVPB
INTRAVENOUS | Status: AC
  Filled 2014-09-26: qty 50

## 2014-09-26 MED ORDER — SODIUM CHLORIDE 0.9 % IV SOLN: 10.0000 mL/h | Freq: Once | INTRAVENOUS | Status: DC

## 2014-09-26 MED ORDER — ACETAMINOPHEN 650 MG RE SUPP: 650.0000 mg | Freq: Four times a day (QID) | RECTAL | Status: DC | PRN

## 2014-09-26 MED ORDER — PANTOPRAZOLE SODIUM 40 MG PO TBEC
40.0000 mg | DELAYED_RELEASE_TABLET | Freq: Every day | ORAL | Status: DC
  Administered 2014-09-27 – 2014-09-29 (×3): 40 mg via ORAL
  Filled 2014-09-26 (×3): qty 1

## 2014-09-26 MED ORDER — INSULIN ASPART 100 UNIT/ML ~~LOC~~ SOLN: 0.0000 [IU] | Freq: Every day | SUBCUTANEOUS | Status: DC

## 2014-09-26 MED ORDER — ONDANSETRON HCL 4 MG/2ML IJ SOLN: 4.0000 mg | Freq: Four times a day (QID) | INTRAMUSCULAR | Status: DC | PRN

## 2014-09-26 MED ORDER — PIPERACILLIN-TAZOBACTAM 4.5 G IVPB
4.5000 g | Freq: Three times a day (TID) | INTRAVENOUS | Status: DC
  Administered 2014-09-26 – 2014-09-28 (×5): 4.5 g via INTRAVENOUS
  Filled 2014-09-26 (×9): qty 100

## 2014-09-26 MED ORDER — INSULIN ASPART 100 UNIT/ML ~~LOC~~ SOLN
0.0000 [IU] | Freq: Three times a day (TID) | SUBCUTANEOUS | Status: DC
  Administered 2014-09-26: 2 [IU] via SUBCUTANEOUS
  Administered 2014-09-27 (×2): 1 [IU] via SUBCUTANEOUS
  Administered 2014-09-28: 11:00:00 3 [IU] via SUBCUTANEOUS
  Filled 2014-09-26: qty 1
  Filled 2014-09-26: qty 3
  Filled 2014-09-26: qty 2
  Filled 2014-09-26: qty 1

## 2014-09-26 MED ORDER — VANCOMYCIN HCL IN DEXTROSE 1-5 GM/200ML-% IV SOLN
INTRAVENOUS | Status: AC
  Filled 2014-09-26: qty 200

## 2014-09-26 MED ORDER — SODIUM CHLORIDE 0.9 % IV SOLN: Freq: Once | INTRAVENOUS | Status: DC

## 2014-09-26 MED ORDER — ACETAMINOPHEN 325 MG PO TABS: 650.0000 mg | ORAL_TABLET | Freq: Four times a day (QID) | ORAL | Status: DC | PRN

## 2014-09-26 MED ORDER — HEPARIN SODIUM (PORCINE) 5000 UNIT/ML IJ SOLN
5000.0000 [IU] | Freq: Three times a day (TID) | INTRAMUSCULAR | Status: DC
  Administered 2014-09-26 – 2014-09-28 (×5): 5000 [IU] via SUBCUTANEOUS
  Filled 2014-09-26 (×5): qty 1

## 2014-09-26 MED ORDER — SODIUM CHLORIDE 0.9 % IV SOLN
1000.0000 mL | INTRAVENOUS | Status: DC
  Administered 2014-09-26 – 2014-09-28 (×4): 1000 mL via INTRAVENOUS

## 2014-09-26 MED ORDER — OXYCODONE-ACETAMINOPHEN 5-325 MG PO TABS: 2.0000 | ORAL_TABLET | ORAL | Status: DC | PRN

## 2014-09-26 NOTE — ED Provider Notes (Signed)
[facility] Emergency Department Provider Note  ____________________________________________  Time seen: 11:30 AM  I have reviewed the triage vital signs and the nursing notes.   HISTORY  Chief Complaint Fever  hypotension    HPI Sean Morgan is a 74 y.o. male who is in a care facility due to an accident this past January. 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 He has had a difficult course and now has ostomies, suprapubic tube, and he has a large sacral wound with a wound VAC on currently.  The patient is now noted to be febrile to 102. He has been treated with antibiotics at the nursing home but they feel they cannot help him further at the moment so he has been sent to the emergency department. He arrives looking somewhat ill and diaphoretic with a low blood pressure of 79 systolic.  Despite his ill appearance, the patient smiles and reports he is doing well. The daughter is in the room and helps with history as well.  He has been having problems with fever for 3 or 4 days apparently.    History reviewed. No pertinent past medical history.  Patient Active Problem List   Diagnosis Date Noted  . Infected decubitus ulcer 09/13/2014  . UTI (urinary tract infection) 09/13/2014  . Sepsis 09/13/2014  . 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 Suprapubic catheter 09/13/2014  . Aspiration pneumonia 09/13/2014  . Left arm swelling 09/13/2014  . ARF (acute renal failure) 09/13/2014  . Hypernatremia 09/13/2014  . Acute blood loss anemia 09/13/2014  . Generalized weakness 09/13/2014  . Encephalopathy, metabolic 09/13/2014  . Dysphagia, pharyngoesophageal phase 09/13/2014  . 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 DVT of left axillary vein, acute 09/13/2014  . Altered mental status   . Quadriplegia 08/27/2014    History reviewed. No pertinent past surgical history.  Current Outpatient Rx  Name  Route  Sig  Dispense  Refill  . baclofen (LIORESAL) 10 MG tablet   Oral    Take 10 mg by mouth 3 (three) times daily.         . carbamazepine (TEGRETOL) 200 MG tablet   Oral   Take 1 tablet (200 mg total) by mouth 2 (two) times daily.   60 tablet   1   . ciprofloxacin (CIPRO) 500 MG tablet   Oral   Take 1 tablet (500 mg total) by mouth 2 (two) times daily.   40 tablet   2   . enoxaparin (LOVENOX) 30 MG/0.3ML injection   Subcutaneous   Inject 30 mg into the skin daily.         Marland Kitchen glipiZIDE (GLUCOTROL) 5 MG tablet   Oral   Take 5 mg by mouth 2 (two) times daily.         . Heparin Sodium Lock Flush 10 UNIT/ML SOLN   Intravenous   Inject 5 mLs into the vein 2 (two) times daily. Use 5 mL in the morning for protocol. Flush PICC with 10 mL of NS before and after IV medication followed by 5 mL of heparin 10U/mL.         . insulin aspart (NOVOLOG) 100 UNIT/ML injection   Subcutaneous   Inject 0-10 Units into the skin 4 (four) times daily -  before meals and at bedtime.   10 mL   11   . lactulose (CHRONULAC) 10 GM/15ML solution   Oral   Take 30 g by mouth daily as needed for mild constipation.         Marland Kitchen  linagliptin (TRADJENTA) 5 MG TABS tablet   Oral   Take 5 mg by mouth daily.         . megestrol (MEGACE ES) 625 MG/5ML suspension   Gastrostomy Tube   625 mg by Gastrostomy Tube route daily. Per MD: Do not interchange/Dispense as Written         . metoprolol tartrate (LOPRESSOR) 25 MG tablet   Oral   Take 1 tablet (25 mg total) by mouth 2 (two) times daily.   60 tablet   2   . metroNIDAZOLE (FLAGYL) 500 MG tablet   Oral   Take 1 tablet (500 mg total) by mouth every 8 (eight) hours.   60 tablet   1   . mirtazapine (REMERON) 15 MG tablet   Oral   Take 1 tablet (15 mg total) by mouth at bedtime.   30 tablet   1   . Multiple Vitamin (MULTIVITAMIN) capsule   Oral   Take 1 capsule by mouth daily.         Marland Kitchen nystatin (MYCOSTATIN) 100000 UNIT/ML suspension   Mouth/Throat   Use as directed 5 mLs (500,000 Units total) in the  mouth or throat 4 (four) times daily.   60 mL   0   . ondansetron (ZOFRAN) 4 MG tablet   Oral   Take 4 mg by mouth every 6 (six) hours as needed for vomiting.         Marland Kitchen oxyCODONE-acetaminophen (PERCOCET/ROXICET) 5-325 MG per tablet   Oral   Take 2 tablets by mouth every 4 (four) hours as needed for severe pain.   40 tablet   0   . pantoprazole (PROTONIX) 40 MG tablet   Oral   Take 40 mg by mouth daily.         . TUBERCULIN PPD ID   Intradermal   Inject 0.1 mLs into the skin once a week.         . vitamin C (ASCORBIC ACID) 500 MG tablet   Oral   Take 500 mg by mouth 2 (two) times daily.         Marland Kitchen zinc sulfate 220 MG capsule   Oral   Take 220 mg by mouth daily.         Marland Kitchen apixaban (ELIQUIS) 5 MG TABS tablet   Oral   Take 2 tablets (10 mg total) by mouth 2 (two) times daily. After Eliquis  loading dose of 10 mg twice daily for 7 days ,  5 mg twice daily dose should be started to be continued at least for 3 months or longer per PCP recomemndations Patient not taking: Reported on 09/26/2014   28 tablet   2   . DAPTOmycin 390 mg in sodium chloride 0.9 % 100 mL   Intravenous   Inject 390 mg into the vein daily. Patient not taking: Reported on 09/26/2014   20 Dose   2   . dronabinol (MARINOL) 2.5 MG capsule   Oral   Take 1 capsule (2.5 mg total) by mouth 2 (two) times daily before lunch and supper. Patient not taking: Reported on 09/26/2014   60 capsule   0   . feeding supplement, GLUCERNA SHAKE, (GLUCERNA SHAKE) LIQD   Oral   Take 237 mLs by mouth 2 (two) times daily between meals. Patient not taking: Reported on 09/26/2014   237 mL   0   . Nutritional Supplements (FEEDING SUPPLEMENT, GLUCERNA 1.2 CAL,) LIQD   Per Tube   Place  1,000 mLs into feeding tube continuous. Patient not taking: Reported on 09/26/2014   1000 mL   6   . Water For Irrigation, Sterile (FREE WATER) SOLN   Per Tube   Place 25 mLs into feeding tube every hour. Patient not taking:  Reported on 09/26/2014   200 mL   3     Allergies Review of patient's allergies indicates no known allergies.  No family history on file.  Social History History  Substance Use Topics  . Smoking status: Former Games developer  . Smokeless tobacco: Not on file     Comment: pt stated no, but could not noted how long   . Alcohol Use: No    Review of Systems  Constitutional: Positive for fever. ENT: Negative for sore throat. Cardiovascular: Negative for chest pain. Respiratory: Negative for shortness of breath. Gastrointestinal: Negative for abdominal pain, vomiting and diarrhea. Notable history of feeding tube, ostomies, and suprapubic tube. Genitourinary: Negative for dysuria. Musculoskeletal: Negative for back pain. Skin: Skin breakdown, ulceration. See history of present illness Neurological: Paraplegic   10-point ROS otherwise negative.  ____________________________________________   PHYSICAL EXAM:  VITAL SIGNS: ED Triage Vitals  Enc Vitals Group     BP 09/26/14 1106 96/50 mmHg     Pulse Rate 09/26/14 1106 88     Resp 09/26/14 1106 16     Temp 09/26/14 1106 101.5 F (38.6 C)     Temp Source 09/26/14 1106 Oral     SpO2 09/26/14 1106 96 %     Weight 09/26/14 1106 210 lb 15.7 oz (95.7 kg)     Height --      Head Cir --      Peak Flow --      Pain Score 09/26/14 1109 0     Pain Loc --      Pain Edu? --      Excl. in GC? --     Constitutional: Alert, communicative, but ill-appearing. He has a little bit diaphoretic. He has tachypnea. ENT   Head: Normocephalic and atraumatic. Cardiovascular: Normal rate, regular rhythm. Respiratory: Patient is to. Breath sounds are clear and equal bilaterally. No wheezes/rales/rhonchi. Gastrointestinal: Soft and nontender. There is a feeding tube present. There are 2 different ostomies from his belly. Lower down there is a suprapubic tube. There is no distention..  Back: No muscle spasm, no tenderness, no CVA  tenderness. Musculoskeletal: Nontender with normal range of motion in all extremities.  No noted edema. Neurologic:  Normal speech and language. No gross focal neurologic deficits are appreciated.  Skin:  Patient with large sacral wound currently covered with a wound back. The wound VAC itself is full of serosanguineous fluid. Psychiatric: Overall pleasant and communicative but ill-appearing. ____________________________________________    LABS (pertinent positives/negatives)  White blood cell count 6.4 hemoglobin at 5.7 and hematocrit of 17.9 Urinalysis shows white blood cells too numerous to count red blood cells too numerous to count  ____________________________________________   EKG  ED ECG REPORT I, Chareese Sergent W, the attending physician, personally viewed and interpreted this ECG.   Date: 09/26/2014  EKG Time: 1207  Rate: 87  Rhythm: Normal sinus rhythm  Axis: 15 Normal  Intervals:  Normal  ST&T Change: Flat T waves especially in V2      ____________________________________________    RADIOLOGY  Chest x-ray: IMPRESSION: 1. Bilateral lung base opacity, increased from previous exam. This is most likely all atelectasis. A component of pneumonia is possible. 2. No pulmonary edema. ____________________________________________   PROCEDURES  Critical Care performed: Yes, see critical care note(s)   CRITICAL CARE Performed by: Darien RamusKAMINSKI,Ferdie Bakken W   Total critical care time: 45 minutes due to the critical condition this patient is then with his hypotension, fever, anemia requiring blood transfusion.  Critical care time was exclusive of separately billable procedures and treating other patients.  Critical care was necessary to treat or prevent imminent or life-threatening deterioration.  Critical care was time spent personally by me on the following activities: development of treatment plan with patient and/or surrogate as well as nursing, discussions with  consultants, evaluation of patient's response to treatment, examination of patient, obtaining history from patient or surrogate, ordering and performing treatments and interventions, ordering and review of laboratory studies, ordering and review of radiographic studies, pulse oximetry and re-evaluation of patient's condition.   ____________________________________________   INITIAL IMPRESSION / ASSESSMENT AND PLAN / ED COURSE  This is a complicated patient with decubitus ulcers are pleased suprapubic tube ostomies feeding tube. He is alert but appears ill. He is febrile. We are starting him on Zosyn now after obtaining cultures both of from peripheral as well as from his PICC line.  We will bolus him with IV fluids.  ----------------------------------------- 1:31 PM on 09/26/2014 -----------------------------------------  Review of results shows hemoglobin at 5.7. I have ordered a transfusion and spoke with the patient and family about his need for blood.  The patient's blood pressure has improved from his initial low level of 79/67. The last reading is 108/48 at 1305.  I have spoken with Dr. Imogene Burnhen of the hospitalist service for admission to the hospital.  ____________________________________________   FINAL CLINICAL IMPRESSION(S) / ED DIAGNOSES  Final diagnoses:  Sepsis, due to unspecified organism  Urinary tract infection associated with catheterization of urinary tract, initial encounter  Decubitus ulcer  Paraplegia      Darien Ramusavid W Esmae Donathan, MD 09/26/14 [phone]

## 2014-09-26 NOTE — ED Notes (Signed)
Per Dr. Imogene Burnhen, ok to use PICC line on floor.

## 2014-09-26 NOTE — Progress Notes (Signed)
ANTIBIOTIC CONSULT NOTE - INITIAL  Pharmacy Consult for  Zosyn and Vancomycin Indication: sepsis  No Known Allergies  Patient Measurements: Weight: 210 lb 15.7 oz (95.7 kg) Adjusted Body Weight: 83.5 kg  Vital Signs: Temp: 99.1 F (37.3 C) (05/30 1803) Temp Source: Oral (05/30 1803) BP: 107/55 mmHg (05/30 1600) Pulse Rate: 97 (05/30 1600) Intake/Output from previous day:   Intake/Output from this shift: Total I/O In: -  Out: 300 [Urine:300]  Labs:  Recent Labs  09/26/14 1119  WBC 6.4  HGB 5.7*  PLT 319  CREATININE 0.87   Estimated Creatinine Clearance: 81.6 mL/min (by C-G formula based on Cr of 0.87). No results for input(s): VANCOTROUGH, VANCOPEAK, VANCORANDOM, GENTTROUGH, GENTPEAK, GENTRANDOM, TOBRATROUGH, TOBRAPEAK, TOBRARND, AMIKACINPEAK, AMIKACINTROU, AMIKACIN in the last 72 hours.   Microbiology: Recent Results (from the past 720 hour(s))  Urine culture     Status: None   Collection Time: 09/03/14  2:30 PM  Result Value Ref Range Status   Specimen Description URINE, CLEAN CATCH  Final   Special Requests NONE  Final   Culture 30,000 COLONIES/ml CANDIDA ALBICANS  Final   Report Status 09/07/2014 FINAL  Final  Culture, blood (single)     Status: None   Collection Time: 09/04/14  7:37 PM  Result Value Ref Range Status   Specimen Description BLOOD  Final   Special Requests NONE  Final   Culture NO GROWTH 5 DAYS  Final   Report Status 09/09/2014 FINAL  Final  MRSA PCR Screening     Status: None   Collection Time: 09/08/14  6:00 AM  Result Value Ref Range Status   MRSA by PCR NEGATIVE NEGATIVE Final    Comment:        The GeneXpert MRSA Assay (FDA approved for NASAL specimens only), is one component of a comprehensive MRSA colonization surveillance program. It is not intended to diagnose MRSA infection nor to guide or monitor treatment for MRSA infections.     Medical History: Past Medical History  Diagnosis Date  . Hypertension   . Diabetes  mellitus without complication   . Anemia   . Decubitus ulcer, stage 4 with infection   . Aspiration pneumonia   . DVT (deep vein thrombosis) in pregnancy   . Quadriplegia   . CVA (cerebral infarction)   . BPH (benign prostatic hypertrophy)     Medications:  Prescriptions prior to admission  Medication Sig Dispense Refill Last Dose  . baclofen (LIORESAL) 10 MG tablet Take 10 mg by mouth 3 (three) times daily.   unknown at unknown  . carbamazepine (TEGRETOL) 200 MG tablet Take 1 tablet (200 mg total) by mouth 2 (two) times daily. 60 tablet 1 unknown at unknown  . ciprofloxacin (CIPRO) 500 MG tablet Take 1 tablet (500 mg total) by mouth 2 (two) times daily. 40 tablet 2 unknown at unknown  . enoxaparin (LOVENOX) 30 MG/0.3ML injection Inject 30 mg into the skin daily.   unknown at unknown  . glipiZIDE (GLUCOTROL) 5 MG tablet Take 5 mg by mouth 2 (two) times daily.   unknown at unknown  . Heparin Sodium Lock Flush 10 UNIT/ML SOLN Inject 5 mLs into the vein 2 (two) times daily. Use 5 mL in the morning for protocol. Flush PICC with 10 mL of NS before and after IV medication followed by 5 mL of heparin 10U/mL. Use 5 mL in the evening for maintenance. Flush each lumen with 10 mL of NS and 5 mL of heparin.   unknown at  unknown  . insulin aspart (NOVOLOG) 100 UNIT/ML injection Inject 0-10 Units into the skin 4 (four) times daily -  before meals and at bedtime. 10 mL 11 unknown at unknown  . lactulose (CHRONULAC) 10 GM/15ML solution Take 30 g by mouth daily as needed for mild constipation.   unknown at unknown  . linagliptin (TRADJENTA) 5 MG TABS tablet Take 5 mg by mouth daily.   unknown at unknown  . megestrol (MEGACE ES) 625 MG/5ML suspension 625 mg by Gastrostomy Tube route daily. Per MD: Do not interchange/Dispense as Written   unknown at unknown  . metoprolol tartrate (LOPRESSOR) 25 MG tablet Take 1 tablet (25 mg total) by mouth 2 (two) times daily. 60 tablet 2 unknown at unknown  . metroNIDAZOLE  (FLAGYL) 500 MG tablet Take 1 tablet (500 mg total) by mouth every 8 (eight) hours. 60 tablet 1 unknown at unknown  . mirtazapine (REMERON) 15 MG tablet Take 1 tablet (15 mg total) by mouth at bedtime. 30 tablet 1 unknown at unknown  . Multiple Vitamin (MULTIVITAMIN) capsule Take 1 capsule by mouth daily.   unknown at unknown  . nystatin (MYCOSTATIN) 100000 UNIT/ML suspension Use as directed 5 mLs (500,000 Units total) in the mouth or throat 4 (four) times daily. 60 mL 0 unknown at unknown  . ondansetron (ZOFRAN) 4 MG tablet Take 4 mg by mouth every 6 (six) hours as needed for vomiting.   unknown at unknown  . oxyCODONE-acetaminophen (PERCOCET/ROXICET) 5-325 MG per tablet Take 2 tablets by mouth every 4 (four) hours as needed for severe pain. 40 tablet 0 unknown at unknown  . pantoprazole (PROTONIX) 40 MG tablet Take 40 mg by mouth daily.   unknown at unknown  . Sodium Chloride Flush (NORMAL SALINE FLUSH) 0.9 % SOLN Inject 10 mLs into the vein 2 (two) times daily. Use 10 mL in the morning for protocol. Flush PICC with 10 mL of NS before and after IV medication followed by 5 mL of heparin 10U/mL. Use 10 mL in the evening for maintenance. Flush each lumen with 10 mL NS followed by 5 mL of heparin.   unknown at unknown  . TUBERCULIN PPD ID Inject 0.1 mLs into the skin once a week.   unknown at unknown  . vitamin C (ASCORBIC ACID) 500 MG tablet Take 500 mg by mouth 2 (two) times daily.   unknown at unknown  . zinc sulfate 220 MG capsule Take 220 mg by mouth daily.   unknown at unknown  . apixaban (ELIQUIS) 5 MG TABS tablet Take 2 tablets (10 mg total) by mouth 2 (two) times daily. After Eliquis  loading dose of 10 mg twice daily for 7 days ,  5 mg twice daily dose should be started to be continued at least for 3 months or longer per PCP recomemndations (Patient not taking: Reported on 09/26/2014) 28 tablet 2   . DAPTOmycin 390 mg in sodium chloride 0.9 % 100 mL Inject 390 mg into the vein daily. (Patient not  taking: Reported on 09/26/2014) 20 Dose 2   . dronabinol (MARINOL) 2.5 MG capsule Take 1 capsule (2.5 mg total) by mouth 2 (two) times daily before lunch and supper. (Patient not taking: Reported on 09/26/2014) 60 capsule 0   . feeding supplement, GLUCERNA SHAKE, (GLUCERNA SHAKE) LIQD Take 237 mLs by mouth 2 (two) times daily between meals. (Patient not taking: Reported on 09/26/2014) 237 mL 0   . Nutritional Supplements (FEEDING SUPPLEMENT, GLUCERNA 1.2 CAL,) LIQD Place 1,000 mLs  into feeding tube continuous. (Patient not taking: Reported on 09/26/2014) 1000 mL 6   . Water For Irrigation, Sterile (FREE WATER) SOLN Place 25 mLs into feeding tube every hour. (Patient not taking: Reported on 09/26/2014) 200 mL 3    Assessment: Pseudomonas risk factors:  Recent use of daptomycin and nystatin  Ke=0.07 hr-1 T1/2 = 9.9 hrs Vd = 58.5 L   Goal of Therapy:  Vancomycin trough level 15-20 mcg/ml  Plan:  Expected duration 7 days with resolution of temperature and/or normalization of WBC   Will start Zosyn 4.5 gm IV Q8H EI @ 21:00.  Will order Vancomycin 1250 mg IV Q12H to start 5/31 @ 3:00.  Stacked dosing not appropriate for this pt. This pt will reach Css on 6/1 @ 15:00. Will draw 1st Vanc trough on 6/1 @ 14:30.   Averiana Clouatre D 09/26/2014,6:17 PM

## 2014-09-26 NOTE — ED Notes (Signed)
Pt reports to ED via EMS.  Pt from [facility]. 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  Per EMS pt recently finished round of antibiotics (Vancomycin).  Per EMS pt has stage 4 pressure ulcer on sacrum w/ wound vac.

## 2014-09-26 NOTE — Progress Notes (Signed)
The Check Character is required. Please add the Check Character to the 13 character unit number.  Started blood at 18:03 at 14920ml/hr then dropped rate to 70 ml/hr after 15 minutes. NS dropped to 55 ml/hr.  Unit [phone] [phone] [id number] Kept receiving above message would not allow me to document rate. Pam charge nurse verified info and unable to get system to scan.

## 2014-09-26 NOTE — H&P (Addendum)
[facility] - [place] at [facility]   PATIENT NAME: Sean Morgan    MR#:  [id number]  DATE OF BIRTH:  [birth year]  DATE OF ADMISSION:  09/26/2014  PRIMARY CARE PHYSICIAN: No PCP Per Patient   REQUESTING/REFERRING PHYSICIAN: Dr. Carollee Massed  CHIEF COMPLAINT:   Chief Complaint  Patient presents with  . Fever    HISTORY OF PRESENT ILLNESS:  Sean Morgan  is a 74 y.o. male with a known history of quadriplegia, suprapubic catheter, infected decubitus ulcer, anemia and DVT. The patient was sent from nursing home to the ED due to fever. The patient is a confused and unable to provide any information. According to Dr. Carollee Massed and the patient's daughter, patient was found to have a fever and confusion, then sent to the ED for further evaluation. The patient was noticed confused, diaphoresis, and hypotension with a systolic blood pressure 79. In addition, the patient was found to have UTI and sacral decubitus ulcer stage IV with possible infection, and was treated with normal saline bolus, vancomycin and Zosyn in ED. Addition, patient hemoglobin is a 5.7.  PAST MEDICAL HISTORY:  History reviewed. No pertinent past medical history. quadriplegia, suprapubic catheter, infected decubitus ulcer, anemia, BPH, CVA, hypertension, diabetes, hyperlipidemia, pancreatitis, thrombocytopenia and DVT  PAST SURGICAL HISTORY:  History reviewed. No pertinent past surgical history.Suprapubic caster, colostomy, PEG placement  SOCIAL HISTORY:   History  Substance Use Topics  . Smoking status: Former Games developer  . Smokeless tobacco: Not on file     Comment: pt stated no, but could not noted how long   . Alcohol Use: No    FAMILY HISTORY:  No family history on file.Diabetes in his mother and father, both diseased.  DRUG ALLERGIES:  No Known Allergies  REVIEW OF SYSTEMS:  The patient is a confused and unable to provide any information or review of system.  MEDICATIONS AT HOME:    Prior to Admission medications   Medication Sig Start Date End Date Taking? Authorizing Provider  baclofen (LIORESAL) 10 MG tablet Take 10 mg by mouth 3 (three) times daily.   Yes Historical Provider, MD  carbamazepine (TEGRETOL) 200 MG tablet Take 1 tablet (200 mg total) by mouth 2 (two) times daily. 09/13/14  Yes Katharina Caper, MD  ciprofloxacin (CIPRO) 500 MG tablet Take 1 tablet (500 mg total) by mouth 2 (two) times daily. 09/13/14  Yes Katharina Caper, MD  enoxaparin (LOVENOX) 30 MG/0.3ML injection Inject 30 mg into the skin daily.   Yes Historical Provider, MD  glipiZIDE (GLUCOTROL) 5 MG tablet Take 5 mg by mouth 2 (two) times daily.   Yes Historical Provider, MD  Heparin Sodium Lock Flush 10 UNIT/ML SOLN Inject 5 mLs into the vein 2 (two) times daily. Use 5 mL in the morning for protocol. Flush PICC with 10 mL of NS before and after IV medication followed by 5 mL of heparin 10U/mL. Use 5 mL in the evening for maintenance. Flush each lumen with 10 mL of NS and 5 mL of heparin.   Yes Historical Provider, MD  insulin aspart (NOVOLOG) 100 UNIT/ML injection Inject 0-10 Units into the skin 4 (four) times daily -  before meals and at bedtime. 09/13/14  Yes Katharina Caper, MD  lactulose (CHRONULAC) 10 GM/15ML solution Take 30 g by mouth daily as needed for mild constipation.   Yes Historical Provider, MD  linagliptin (TRADJENTA) 5 MG TABS tablet Take 5 mg by mouth daily.   Yes Historical Provider, MD  megestrol (MEGACE  ES) 625 MG/5ML suspension 625 mg by Gastrostomy Tube route daily. Per MD: Do not interchange/Dispense as Written   Yes Historical Provider, MD  metoprolol tartrate (LOPRESSOR) 25 MG tablet Take 1 tablet (25 mg total) by mouth 2 (two) times daily. 09/13/14  Yes Katharina Caper, MD  metroNIDAZOLE (FLAGYL) 500 MG tablet Take 1 tablet (500 mg total) by mouth every 8 (eight) hours. 09/13/14  Yes Katharina Caper, MD  mirtazapine (REMERON) 15 MG tablet Take 1 tablet (15 mg total) by mouth at bedtime.  09/13/14  Yes Katharina Caper, MD  Multiple Vitamin (MULTIVITAMIN) capsule Take 1 capsule by mouth daily.   Yes Historical Provider, MD  nystatin (MYCOSTATIN) 100000 UNIT/ML suspension Use as directed 5 mLs (500,000 Units total) in the mouth or throat 4 (four) times daily. 09/13/14  Yes Katharina Caper, MD  ondansetron (ZOFRAN) 4 MG tablet Take 4 mg by mouth every 6 (six) hours as needed for vomiting.   Yes Historical Provider, MD  oxyCODONE-acetaminophen (PERCOCET/ROXICET) 5-325 MG per tablet Take 2 tablets by mouth every 4 (four) hours as needed for severe pain. 09/13/14  Yes Katharina Caper, MD  pantoprazole (PROTONIX) 40 MG tablet Take 40 mg by mouth daily.   Yes Historical Provider, MD  Sodium Chloride Flush (NORMAL SALINE FLUSH) 0.9 % SOLN Inject 10 mLs into the vein 2 (two) times daily. Use 10 mL in the morning for protocol. Flush PICC with 10 mL of NS before and after IV medication followed by 5 mL of heparin 10U/mL. Use 10 mL in the evening for maintenance. Flush each lumen with 10 mL NS followed by 5 mL of heparin.   Yes Historical Provider, MD  TUBERCULIN PPD ID Inject 0.1 mLs into the skin once a week.   Yes Historical Provider, MD  vitamin C (ASCORBIC ACID) 500 MG tablet Take 500 mg by mouth 2 (two) times daily.   Yes Historical Provider, MD  zinc sulfate 220 MG capsule Take 220 mg by mouth daily.   Yes Historical Provider, MD  apixaban (ELIQUIS) 5 MG TABS tablet Take 2 tablets (10 mg total) by mouth 2 (two) times daily. After Eliquis  loading dose of 10 mg twice daily for 7 days ,  5 mg twice daily dose should be started to be continued at least for 3 months or longer per PCP recomemndations Patient not taking: Reported on 09/26/2014 09/13/14 09/27/14  Katharina Caper, MD  DAPTOmycin 390 mg in sodium chloride 0.9 % 100 mL Inject 390 mg into the vein daily. Patient not taking: Reported on 09/26/2014 09/13/14   Katharina Caper, MD  dronabinol (MARINOL) 2.5 MG capsule Take 1 capsule (2.5 mg total) by mouth 2  (two) times daily before lunch and supper. Patient not taking: Reported on 09/26/2014 09/13/14   Katharina Caper, MD  feeding supplement, GLUCERNA SHAKE, (GLUCERNA SHAKE) LIQD Take 237 mLs by mouth 2 (two) times daily between meals. Patient not taking: Reported on 09/26/2014 09/13/14   Katharina Caper, MD  Nutritional Supplements (FEEDING SUPPLEMENT, GLUCERNA 1.2 CAL,) LIQD Place 1,000 mLs into feeding tube continuous. Patient not taking: Reported on 09/26/2014 09/13/14   Katharina Caper, MD  Water For Irrigation, Sterile (FREE WATER) SOLN Place 25 mLs into feeding tube every hour. Patient not taking: Reported on 09/26/2014 09/13/14   Katharina Caper, MD      VITAL SIGNS:  Blood pressure 106/45, pulse 88, temperature 98.3 F (36.8 C), temperature source Oral, resp. rate 15, weight 95.7 kg (210 lb 15.7 oz), SpO2 95 %.  PHYSICAL EXAMINATION:  GENERAL:  74 y.o.-year-old patient lying in the bed with no acute distress.  EYES: Pupils equal, round, reactive to light and accommodation. No scleral icterus. Extraocular muscles intact.  HEENT: Head atraumatic, normocephalic.Dry oral mucosa.  NECK:  Supple, no jugular venous distention. No thyroid enlargement, no tenderness.  LUNGS: Normal breath sounds bilaterally, no wheezing, rales,rhonchi or crepitation. No use of accessory muscles of respiration.  CARDIOVASCULAR: S1, S2 normal. No murmurs, rubs, or gallops.  ABDOMEN: Soft, nontender, nondistended. Bowel sounds present. PEG tube in situ, 2 colostomy bags in situ,  suprapubic caster in situ. Sacral DU stage IV in dressing EXTREMITIES:Bilateral lower extremity edema,  no cyanosis, or clubbing.  NEUROLOGIC: The patient isdisoriented and confused, unable to follow commands, unable to to Exam. PSYCHIATRIC: The patient disdisoriented and confused, unable to follow commands.  SKIN: No obvious rash or jaundice.  LABORATORY PANEL:   CBC  Recent Labs Lab 09/26/14 1119  WBC 6.4  HGB 5.7*  HCT 17.9*  PLT 319    ------------------------------------------------------------------------------------------------------------------  Chemistries   Recent Labs Lab 09/26/14 1119  NA 127*  K 5.1  CL 95*  CO2 23  GLUCOSE 178*  BUN 36*  CREATININE 0.87  CALCIUM 6.8*  AST 50*  ALT 18  ALKPHOS 293*  BILITOT 0.7   ------------------------------------------------------------------------------------------------------------------  Cardiac Enzymes No results for input(s): TROPONINI in the last 168 hours. ------------------------------------------------------------------------------------------------------------------  RADIOLOGY:  Dg Chest Port 1 View  09/26/2014   CLINICAL DATA:  Pt reports to ED via EMS. Pt from [facility]. 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 Per EMS pt recently finished round of antibiotics (Vancomycin). Per EMS pt has stage 4 pressure ulcer on sacrum w/ wound vac. Decreased BP in ER.  EXAM: PORTABLE CHEST - 1 VIEW  COMPARISON:  09/07/2014  FINDINGS: There is lung base opacity that has increased from the prior study. This may all be atelectasis. Pneumonia should be considered if there are consistent clinical symptoms. No convincing pulmonary edema no obvious pleural effusion no pneumothorax.  Cardiac silhouette is normal in size.  No mediastinal hilar masses.  Right PICC has its tip projecting in the lower superior vena cava, well positioned and more distal than on the prior study.  IMPRESSION: 1. Bilateral lung base opacity, increased from previous exam. This is most likely all atelectasis. A component of pneumonia is possible. 2. No pulmonary edema.   Electronically Signed   By: Amie Portland M.D.   On: 09/26/2014 12:30    EKG:   Orders placed or performed during the hospital encounter of 09/26/14  . ED EKG  . ED EKG    sepsis UTI Severe anemia Hypotension Dehydration Hyponatremia Lactic acidosis Sacral DU stage IV with infection Diabetes  The patient will be  admitted to stepdown unit. I will hold Lopressor continue normal saline IV, and Levophed when necessary. I will continue Zosyn and vancomycin dose per pharmacy, follow-up CBC, blood culture, and urine culture. I will get a wound care and surgical consult for sacral DU infection. The patient will get a PRBC transfusion due to severe anemia, and follow-up CBC in the stool occult. For dehydration and hyponatremia, continue IV normal saline and follow-up BMP.  For diabetes, I will hold by mouth diabetes medication and start a sliding scale, follow-up hemoglobin A1c. For hypertension, hold Lopressor due to hypotension.    All the records are reviewed and case discussed with ED provider. Management plans discussed with the patient's daughter, she said that the  patient is a full code and she is in agreement.  CODE STATUS: Full code TOTAL CRITICAL TIME TAKING CARE OF THIS PATIENT: 68 minutes.    Shaune Pollackhen, Chyan Carnero M.D on 09/26/2014 at 3:05 PM  Between 7am to 6pm - Pager - [phone]  After 6pm go to [email or web address] - password [email or web address] [facility]  [facility]  Office  [phone]  CC: Primary care physician; No PCP Per Patient

## 2014-09-26 NOTE — ED Notes (Signed)
Patient's BP dropped to 79/67. Dr. Carollee MassedKaminski notified and to bedside.
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
## 2014-09-27 ENCOUNTER — Inpatient Hospital Stay: Payer: Medicare PPO

## 2014-09-27 DIAGNOSIS — N319 Neuromuscular dysfunction of bladder, unspecified: Secondary | ICD-10-CM

## 2014-09-27 DIAGNOSIS — G8250 Quadriplegia, unspecified: Secondary | ICD-10-CM

## 2014-09-27 DIAGNOSIS — L8990 Pressure ulcer of unspecified site, unspecified stage: Secondary | ICD-10-CM | POA: Insufficient documentation

## 2014-09-27 DIAGNOSIS — Z933 Colostomy status: Secondary | ICD-10-CM

## 2014-09-27 DIAGNOSIS — L89154 Pressure ulcer of sacral region, stage 4: Secondary | ICD-10-CM

## 2014-09-27 DIAGNOSIS — Z79899 Other long term (current) drug therapy: Secondary | ICD-10-CM

## 2014-09-27 DIAGNOSIS — A419 Sepsis, unspecified organism: Secondary | ICD-10-CM

## 2014-09-27 DIAGNOSIS — Z8673 Personal history of transient ischemic attack (TIA), and cerebral infarction without residual deficits: Secondary | ICD-10-CM

## 2014-09-27 DIAGNOSIS — E119 Type 2 diabetes mellitus without complications: Secondary | ICD-10-CM

## 2014-09-27 DIAGNOSIS — Z794 Long term (current) use of insulin: Secondary | ICD-10-CM

## 2014-09-27 DIAGNOSIS — I10 Essential (primary) hypertension: Secondary | ICD-10-CM

## 2014-09-27 DIAGNOSIS — Z515 Encounter for palliative care: Secondary | ICD-10-CM

## 2014-09-27 LAB — BASIC METABOLIC PANEL
Anion gap: 5 (ref 5–15)
BUN: 19 mg/dL (ref 6–20)
CHLORIDE: 102 mmol/L (ref 101–111)
CO2: 29 mmol/L (ref 22–32)
Calcium: 8.6 mg/dL — ABNORMAL LOW (ref 8.9–10.3)
Creatinine, Ser: 0.41 mg/dL — ABNORMAL LOW (ref 0.61–1.24)
GFR calc Af Amer: >60 mL/min (ref >60)
GFR calc non Af Amer: >60 mL/min (ref >60)
GLUCOSE: 229 mg/dL — AB (ref 65–99)
POTASSIUM: 4 mmol/L (ref 3.5–5.1)
Sodium: 136 mmol/L (ref 135–145)

## 2014-09-27 LAB — CBC WITH DIFFERENTIAL/PLATELET
BASOS ABS: 0 10*3/uL (ref 0–0.1)
Basophils Relative: 0 %
Eosinophils Absolute: 0.2 10*3/uL (ref 0–0.7)
HEMATOCRIT: 27 % — AB (ref 40.0–52.0)
HEMOGLOBIN: 8.6 g/dL — AB (ref 13.0–18.0)
Lymphocytes Relative: 21 %
Lymphs Abs: 1.3 10*3/uL (ref 1.0–3.6)
MCH: 28.3 pg (ref 26.0–34.0)
MCHC: 31.8 g/dL — ABNORMAL LOW (ref 32.0–36.0)
MCV: 89 fL (ref 80.0–100.0)
Monocytes Absolute: 0.8 10*3/uL (ref 0.2–1.0)
Monocytes Relative: 13 %
Neutro Abs: 3.8 10*3/uL (ref 1.4–6.5)
Platelets: 311 10*3/uL (ref 150–440)
RBC: 3.04 MIL/uL — ABNORMAL LOW (ref 4.40–5.90)
RDW: 22.4 % — ABNORMAL HIGH (ref 11.5–14.5)
WBC: 6.1 10*3/uL (ref 3.8–10.6)

## 2014-09-27 LAB — GLUCOSE, CAPILLARY
GLUCOSE-CAPILLARY: 197 mg/dL — AB (ref 65–99)
GLUCOSE-CAPILLARY: 142 mg/dL — AB (ref 65–99)
GLUCOSE-CAPILLARY: 128 mg/dL — AB (ref 65–99)
GLUCOSE-CAPILLARY: 156 mg/dL — AB (ref 65–99)
Glucose-Capillary: 133 mg/dL — ABNORMAL HIGH (ref 65–99)

## 2014-09-27 LAB — HEMOGLOBIN A1C: Hgb A1c MFr Bld: <4 % — ABNORMAL LOW (ref 4.0–6.0)

## 2014-09-27 MED ORDER — FREE WATER
25.0000 mL | Status: DC
  Administered 2014-09-27 – 2014-09-29 (×12): 25 mL

## 2014-09-27 MED ORDER — GLUCERNA 1.5 CAL PO LIQD
1000.0000 mL | ORAL | Status: DC
  Administered 2014-09-27: 15:00:00 1000 mL via ORAL
  Filled 2014-09-27 (×2): qty 1000

## 2014-09-27 MED ORDER — SODIUM CHLORIDE 0.9 % IV SOLN
390.0000 mg | INTRAVENOUS | Status: DC
  Filled 2014-09-27: qty 7.8

## 2014-09-27 MED ORDER — CETYLPYRIDINIUM CHLORIDE 0.05 % MT LIQD
7.0000 mL | Freq: Two times a day (BID) | OROMUCOSAL | Status: DC
  Administered 2014-09-27 – 2014-09-29 (×5): 7 mL via OROMUCOSAL

## 2014-09-27 MED ORDER — BACLOFEN 1 MG/ML ORAL SUSPENSION: 10.0000 mg | Freq: Three times a day (TID) | ORAL | Status: DC

## 2014-09-27 MED ORDER — SODIUM CHLORIDE 0.9 % IV SOLN
4.0000 mg/kg | INTRAVENOUS | Status: DC
  Administered 2014-09-27: 383 mg via INTRAVENOUS
  Filled 2014-09-27 (×2): qty 7.66

## 2014-09-27 MED ORDER — BACLOFEN 10 MG PO TABS
10.0000 mg | ORAL_TABLET | Freq: Three times a day (TID) | ORAL | Status: DC
  Administered 2014-09-27 – 2014-09-29 (×7): 10 mg via ORAL
  Filled 2014-09-27 (×7): qty 1

## 2014-09-27 NOTE — Progress Notes (Signed)
He is more stable this morning with normal tension and no evidence for significant tachycardia. He has severe wound problems which are progressing rapidly. I reviewed the wound care nurse evaluation. He will need aggressive surgical intervention with a low albumin likely to result in continued failure of his wound healing. I really feel that palliative care is the right option here with comfort and supportive care is required. However if the family still insists on pursuing surgical intervention will require aggressive surgical debridement wound VAC and possible long-term ventilator assistance.

## 2014-09-27 NOTE — Consult Note (Signed)
I spoke with pt's daughter, Delice Bisonara, by phone. Updated her on pt's condition. She was unaware that pt had developed new pressure ulcers until RN told her this AM. Daughter feels that pt is "not even there" and she feels that he is suffering. However, daughter says that pt's brother and sister want pt to have continued aggressive care. She asks that I meet with the family again to discuss goals of therapy. She will try to arrange for brother to be here tomorrow and will get sister on the phone.   Daughter expressed appreciation for phone call. All questions answered.

## 2014-09-27 NOTE — Consult Note (Signed)
WOC wound consult note Reason for Consult: Chronic Stage IV sacral wound, multiple pressure ulcers, awaiting surgical consult vs palliative care.  Wound type: Chronic pressure ulcers, severe protein and calorie malnutrition Pressure Ulcer POA: Yes Measurement: sacrum 10 cm x 7 cm x 4 cm 100% necrotic tissue in wound bed. New pressure ulcers, since last admission to back, buttocks Bilateral heel pressure ulcers, 100% necrotic tissue. [facility]  Will await treatment plan for surgery vs palliative care. Have ordered a Low Air Loss Mattress replacement  Wound bed:100% necrotic Drainage (amount, consistency, odor) Moderate purulent.  Necrotic odor.  Periwound: Moisture and Incontinence Associated Skin Damage to buttocks.  Dressing procedure/placement/frequency: While awaiting further consultation, will keep wounds clean and moist with twice daily NS moist kerlix dressings.  Top with ABD pad/tape.   Will not follow at this time.  Please re-consult if needed.  Maple HudsonKaren Buren Havey RN BSN CWON Pager [phone]

## 2014-09-27 NOTE — Progress Notes (Addendum)
Initial Nutrition Assessment  DOCUMENTATION CODES:     INTERVENTION:   (Enteral nutrition). Consulted to restart enteral nutrition via PEG. Recommend glucerna 1.5 at goal rate of 10680ml/hr.  Will provide 2880 kcals, 158 g protein and 1459ml free water.  Recommend free water flush of at 4225ml/hr q 4 hr at this time as currently on IV fluids. Spoke with Dr. Nemiah CommanderKalisetti and agreeable to restarting tube feeding and does not want to restart po diet at this time.   NUTRITION DIAGNOSIS:  Inadequate oral intake related to wound healing, inability to eat as evidenced by other (see comment) (restarting PEG tube feeding).    GOAL:  Patient will meet greater than or equal to 90% of their needs    MONITOR:   (Enteral nutrition, Electrolyte and renal profile, Anthropometric, Digestive system)  REASON FOR ASSESSMENT:  Consult Enteral/tube feeding initiation and management  ASSESSMENT:  Pt admitted with fever, sepsis, multiple wounds (wound vac in place on sacral wound), DVT, anemia. Possible debridement. Pallative care following  Past Medical History  Diagnosis Date  . Hypertension   . Diabetes mellitus without complication   . Anemia   . Decubitus ulcer, stage 4 with infection   . Aspiration pneumonia   . DVT (deep vein thrombosis) in pregnancy   . Quadriplegia   . CVA (cerebral infarction)   . BPH (benign prostatic hypertrophy)    Pt with PEG tube, double colostomies, quadraplegia  [facility] and reports pt was on puree diet with thin liquids but not eating.  Recently increased tube feeding of glucerna 1.5 secondary to not eating (glucerna 1.5 at 1525ml/hr for 15 hr)  Electrolyte and Renal Profile:    Recent Labs Lab 09/26/14 1119 09/26/14 1823 09/27/14 0423  BUN 36*  --  19  CREATININE 0.87  --  0.41*  NA 127*  --  136  K 5.1  --  4.0  MG  --  2.4  --    Glucose Profile:  Recent Labs  09/26/14 2055 09/27/14 0720 09/27/14 1132  GLUCAP 176* 142*  128*   Medications: NS at 1925ml/hr, aspart, megace, nystatin  Height:  Ht Readings from Last 1 Encounters:  08/26/14 6' 0.99" (1.854 m)    Weight:  Wt Readings from Last 1 Encounters:  09/26/14 210 lb 15.7 oz (95.7 kg)    RD, Eber Jonesarolyn reports last wt of 200 pounds [facility] Reweighed pt in room this am and 202 pounds. (increased fluid accumulation noted)  Wt Readings from Last 10 Encounters:  09/26/14 210 lb 15.7 oz (95.7 kg)   Nutrition-Focused physical exam completed. Findings are mild/moderate fat depletion in orbital region, moderate depletion in temple, clavicle, scapular region muscle depletion, and severe edema.    BMI:  Body mass index is 27.84 kg/(m^2).  Estimated Nutritional Needs:  Kcal:  BEE 1688 kcals (IF 1.1-1.3, AF 1.2) 2228-2633 kcals/d  Protein:  (1.2-1.5 g/d) 110-138 g/d   Fluid:  (25-2930ml/kg) 2300-272260ml/d  Skin:  Multiple pressure ulcers noted (stage II-IV), wound vac in place on sacrum  Diet Order:    EDUCATION NEEDS:  No education needs identified at this time   Intake/Output Summary (Last 24 hours) at 09/27/14 1237 Last data filed at 09/27/14 1025  Gross per 24 hour  Intake 1047.66 ml  Output   1750 ml  Net -702.34 ml    Last BM:  Output in ostomy noted  HIGH Care Level Vic Esco B. Freida BusmanAllen, RD, LDN [phone] (pager)

## 2014-09-27 NOTE — Progress Notes (Signed)
Wounds were measured , cultured and documented by Dois DavenportSandra B. CT reported that they would do the ultrasound of right arm later today to check for DVT. Wound care was consulted, palliative care was consulted. Dietary was consulted and will be starting tube feeding today. Pts hemoglobin is stable. We are currently waiting for family to come and then will notify Dr. Harvie JuniorPhifer who will meet with them to discuss the plan of care.   Report was given to ReynoldsJeff. It was conveyed that the pt has a wound vac [facility] and that it needed to stay with the patient.   Upon transfer, pt stated to Dyke BrackettSandra B. "Why won't they just let me die." Viviann SpareSteven with Phifer's team was notified and we encouraged the pt. To share his thoughts with his family and the medical team.

## 2014-09-27 NOTE — Plan of Care (Signed)
Problem: Problem: Skin/Wound Progression Goal: HEALING SURGICAL WOUND Outcome: Not Met (add Reason) Pt admitted with multiple decubs
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
## 2014-09-27 NOTE — Progress Notes (Signed)
ANTIBIOTIC CONSULT NOTE - INITIAL  Pharmacy Consult for Daptomycin  Indication: sepsis  and scaral decub   No Known Allergies  Patient Measurements: Weight: 210 lb 15.7 oz (95.7 kg) Adjusted Body Weight:   Vital Signs: Temp: 97.4 F (36.3 C) (05/31 1258) Temp Source: Oral (05/31 0500) BP: 146/78 mmHg (05/31 1258) Pulse Rate: 95 (05/31 1258) Intake/Output from previous day: 05/30 0701 - 05/31 0700 In: 940 [Blood:490; IV Piggyback:450] Out: 1750 [Urine:1100; Stool:650] Intake/Output from this shift: Total I/O In: 107.7 [IV Piggyback:107.7] Out: -   Labs:  Recent Labs  09/26/14 1119 09/27/14 0423 09/27/14 0650  WBC 6.4  --  6.1  HGB 5.7*  --  8.6*  PLT 319  --  311  CREATININE 0.87 0.41*  --    Estimated Creatinine Clearance: 88.8 mL/min (by C-G formula based on Cr of 0.41). No results for input(s): VANCOTROUGH, VANCOPEAK, VANCORANDOM, GENTTROUGH, GENTPEAK, GENTRANDOM, TOBRATROUGH, TOBRAPEAK, TOBRARND, AMIKACINPEAK, AMIKACINTROU, AMIKACIN in the last 72 hours.   Microbiology: Recent Results (from the past 720 hour(s))  Urine culture     Status: None   Collection Time: 09/03/14  2:30 PM  Result Value Ref Range Status   Specimen Description URINE, CLEAN CATCH  Final   Special Requests NONE  Final   Culture 30,000 COLONIES/ml CANDIDA ALBICANS  Final   Report Status 09/07/2014 FINAL  Final  Culture, blood (single)     Status: None   Collection Time: 09/04/14  7:37 PM  Result Value Ref Range Status   Specimen Description BLOOD  Final   Special Requests NONE  Final   Culture NO GROWTH 5 DAYS  Final   Report Status 09/09/2014 FINAL  Final  MRSA PCR Screening     Status: None   Collection Time: 09/08/14  6:00 AM  Result Value Ref Range Status   MRSA by PCR NEGATIVE NEGATIVE Final    Comment:        The GeneXpert MRSA Assay (FDA approved for NASAL specimens only), is one component of a comprehensive MRSA colonization surveillance program. It is not intended to  diagnose MRSA infection nor to guide or monitor treatment for MRSA infections.   Culture, blood (routine x 2)     Status: None (Preliminary result)   Collection Time: 09/26/14 11:19 AM  Result Value Ref Range Status   Specimen Description BLOOD  Final   Special Requests BLOOD  Final   Culture NO GROWTH < 24 HOURS  Final   Report Status PENDING  Incomplete  Culture, blood (routine x 2)     Status: None (Preliminary result)   Collection Time: 09/26/14 11:19 AM  Result Value Ref Range Status   Specimen Description BLOOD  Final   Special Requests BLOOD  Final   Culture NO GROWTH < 24 HOURS  Final   Report Status PENDING  Incomplete  Urine culture     Status: None (Preliminary result)   Collection Time: 09/26/14 11:19 AM  Result Value Ref Range Status   Specimen Description URINE, RANDOM  Final   Special Requests NONE  Final   Culture   Final    >=100,000 COLONIES/mL GRAM NEGATIVE RODS IDENTIFICATION AND SUSCEPTIBILITIES TO FOLLOW    Report Status PENDING  Incomplete  Blood culture (routine x 2)     Status: None (Preliminary result)   Collection Time: 09/26/14 12:49 PM  Result Value Ref Range Status   Specimen Description BLOOD  Final   Special Requests NONE  Final   Culture  Setup Time  Final    GRAM NEGATIVE RODS AEROBIC BOTTLE ONLY CRITICAL RESULT CALLED TO, READ BACK BY AND VERIFIED WITH: MICHELLE WILLIAMS @ 0413 5.31.16 MPG CONFIRMED BY AJO    Culture NO GROWTH < 24 HOURS  Final   Report Status PENDING  Incomplete    Medical History: Past Medical History  Diagnosis Date  . Hypertension   . Diabetes mellitus without complication   . Anemia   . Decubitus ulcer, stage 4 with infection   . Aspiration pneumonia   . DVT (deep vein thrombosis) in pregnancy   . Quadriplegia   . CVA (cerebral infarction)   . BPH (benign prostatic hypertrophy)     Medications:  Scheduled:  . sodium chloride  10 mL/hr Intravenous Once  . sodium chloride   Intravenous Once  .  antiseptic oral rinse  7 mL Mouth Rinse q12n4p  . baclofen  10 mg Oral TID  . carbamazepine  200 mg Oral BID  . [START ON 09/28/2014] DAPTOmycin (CUBICIN)  IV  390 mg Intravenous Q24H  . free water  25 mL Per Tube Q4H  . heparin  5,000 Units Subcutaneous 3 times per day  . insulin aspart  0-5 Units Subcutaneous QHS  . insulin aspart  0-9 Units Subcutaneous TID WC  . megestrol  625 mg Oral Daily  . mirtazapine  15 mg Oral QHS  . nystatin  5 mL Mouth/Throat QID  . pantoprazole  40 mg Oral Daily  . piperacillin-tazobactam (ZOSYN)  IV  4.5 g Intravenous 3 times per day  . zinc sulfate  220 mg Oral Daily   Assessment: Patient previously on Vancomycin now started on Daptomycin for sacral decub.   Goal of Therapy:  Resolution of problem   Plan:  Follow up culture results  Will continue Daptomycin 390 mg IV daily.  Sean Morgan D. Neeraj Housand, PharmD 09/27/2014,1:23 PM

## 2014-09-27 NOTE — Progress Notes (Signed)
Pt lying in bed resting quietly oriented to self. Pt will open eyes to verbal stimuli, more alert since shift started. Pt received 2 units PRBC tolerated well. No changes in care noted sacral decub drsg in place with a large amount of drainage noted changed once this shift.  Will cont to monitor.
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
## 2014-09-28 DIAGNOSIS — I82621 Acute embolism and thrombosis of deep veins of right upper extremity: Secondary | ICD-10-CM

## 2014-09-28 LAB — BASIC METABOLIC PANEL
Anion gap: 3 — ABNORMAL LOW (ref 5–15)
BUN: 25 mg/dL — ABNORMAL HIGH (ref 6–20)
CO2: 26 mmol/L (ref 22–32)
CREATININE: 0.74 mg/dL (ref 0.61–1.24)
Calcium: 7.3 mg/dL — ABNORMAL LOW (ref 8.9–10.3)
Chloride: 110 mmol/L (ref 101–111)
GFR calc Af Amer: >60 mL/min (ref >60)
GLUCOSE: 187 mg/dL — AB (ref 65–99)
Potassium: 4.6 mmol/L (ref 3.5–5.1)
Sodium: 139 mmol/L (ref 135–145)

## 2014-09-28 LAB — CBC
HCT: 22.2 % — ABNORMAL LOW (ref 40.0–52.0)
Hemoglobin: 7.4 g/dL — ABNORMAL LOW (ref 13.0–18.0)
MCH: 29.4 pg (ref 26.0–34.0)
MCHC: 33.5 g/dL (ref 32.0–36.0)
MCV: 87.7 fL (ref 80.0–100.0)
PLATELETS: 342 10*3/uL (ref 150–440)
RBC: 2.53 MIL/uL — AB (ref 4.40–5.90)
RDW: 22.3 % — ABNORMAL HIGH (ref 11.5–14.5)
WBC: 6.9 10*3/uL (ref 3.8–10.6)

## 2014-09-28 LAB — OCCULT BLOOD X 1 CARD TO LAB, STOOL
FECAL OCCULT BLD: POSITIVE — AB
Fecal Occult Bld: POSITIVE — AB

## 2014-09-28 LAB — GLUCOSE, CAPILLARY
GLUCOSE-CAPILLARY: 199 mg/dL — AB (ref 65–99)
Glucose-Capillary: 236 mg/dL — ABNORMAL HIGH (ref 65–99)

## 2014-09-28 MED ORDER — MORPHINE SULFATE 2 MG/ML IJ SOLN
2.0000 mg | INTRAMUSCULAR | Status: DC | PRN
  Administered 2014-09-29 (×2): 4 mg via INTRAVENOUS
  Filled 2014-09-28 (×2): qty 2

## 2014-09-28 MED ORDER — LORAZEPAM 0.5 MG PO TABS: 0.5000 mg | ORAL_TABLET | ORAL | Status: AC | PRN

## 2014-09-28 MED ORDER — LORAZEPAM 0.5 MG PO TABS: 0.5000 mg | ORAL_TABLET | ORAL | Status: DC | PRN

## 2014-09-28 MED ORDER — MORPHINE SULFATE (CONCENTRATE) 10 MG /0.5 ML PO SOLN: 10.0000 mg | ORAL | Status: AC | PRN
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
## 2014-09-28 NOTE — Plan of Care (Signed)
Problem: Discharge Progression Outcomes Goal: Discharge plan in place and appropriate Individualization of care  1. Has history of quadriplegia, suprapubic catheter, infected decubitus ulcer, anemia, BPH, CVA, hypertension, diabetes, hyperlipidemia, pancreatitis, thrombocytopenia and DVT. 2. Lives at a skilled nursing facility. 3. A quadriplegic, on low falls.    Goal: Other Discharge Outcomes/Goals Plan of care progress to goal for: sepsis - Continues ABX. - Dressing changed this shift. - No complaints of pain. - Continuous feeding increased to 2680ml/hr  per order, tolerating. Will continue to monitor.

## 2014-09-28 NOTE — Progress Notes (Signed)
Discharge postponed to tomorrow
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
## 2014-09-28 NOTE — Progress Notes (Signed)
   09/28/14 1400  Clinical Encounter Type  Visited With Patient  Visit Type Spiritual support  Referral From Nurse  Consult/Referral To Chaplain  Spiritual Encounters  Spiritual Needs Prayer  Stress Factors  Patient Stress Factors Health changes  Family Stress Factors None identified  Advance Directives (For Healthcare)  Does patient have an advance directive? Yes  Type of Advance Directive Out of facility DNR (pink MOST or yellow form)   Chaplain provided therapeutic presence, empathic listening and prayer for patient. Patient's family was not available.   AD [phone]

## 2014-09-28 NOTE — Progress Notes (Addendum)
Nutrition Brief Note  Chart reviewed. Pt now transitioning to comfort care. Per RN Trey PaulaJeff discussion with MD Nemiah CommanderKalisetti, pt to continue to on TF as currently ordered until discharge to Hospice home tomorrow with pleasure feeds per Dysphagia I diet order. No further nutrition interventions warranted at this time.  Please re-consult as needed.   Leda QuailAllyson Manreet Kiernan, [place]RD, LDN Pager [phone]
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
## 2014-09-28 NOTE — Clinical Social Work Note (Signed)
Clinical Social Work Assessment  Patient Details  Name: Sean Morgan MRN: [id number] Date of Birth: 03/16/1941  Date of referral:  09/28/14               Reason for consult:  Facility Placement                Permission sought to share information with:  Family Supports Permission granted to share information::  No   Housing/Transportation Living arrangements for the past 2 months:  Skilled Building survey[facility]ursing Facility Source of Information:  Palliative Care Team, Adult Children Patient Interpreter Needed:  None Criminal Activity/Legal Involvement Pertinent to Current Situation/Hospitalization:  No - Comment as needed Significant Relationships:  Adult Children Lives with:  Facility Resident Do you feel safe going back to the place where you live?  Yes Need for family participation in patient care:  Yes (Comment)  Care giving concerns:  [facility] and is now comfort care.    Social Worker assessment / plan:  CSW spoke with pt's daughter, Delice Bisonara, to address discharge plan. CSW introduced herself and explained role of social work. Pt is now comfort care. Pt's family has chosen [facility] residential hosp[facility] for end of life care.   Referral has been sent and reviewed. Per [facility], pt does not meet criteria for their residential hospice at this time and would like review pt tomorrow.   CSW updated pt's daughter, who is agreeable to [facility], pt will be evaluated in the morning.   CSW update Palliative Care Team and MD. CSW will continue to follow.   Employment status:  Retired Public house managernsurance information:    [employer] Medicare and Medicaid [place] PT Recommendations:   Not assessed at this time.  Information / Referral to community resources:  Other (Comment Required) (Residential Hospice Home)  Patient/Family's Response to care:  Pt's daughter was appreciative to CSW support.   Patient/Family's Understanding of and Emotional Response to Diagnosis, Current  Treatment, and Prognosis:  Pt's family understands that pt is at the end of life and needs placement.   Emotional Assessment Appearance:  Appears stated age Attitude/Demeanor/Rapport:  Unable to Assess, Other (pt is comfort care) Affect (typically observed):  Unable to Assess Orientation:  Fluctuating Orientation (Suspected and/or reported Sundowners) Alcohol / Substance use:  Never Used Psych involvement (Current and /or in the community):  No (Comment)  Discharge Needs  Concerns to be addressed:  Grief and Loss Concerns Readmission within the last 30 days:  No Current discharge risk:  None Barriers to Discharge:  Hospice Bed not available   Dede QuerySarah Stela Iwasaki, LCSW 09/28/2014, 3:10 PM

## 2014-09-28 NOTE — Consult Note (Signed)
I met with pt's family including daughter, son and brother. Updated them on pt's current condition. We discussed the options of continued aggressive care vs transition to comfort care. Family understand that pt will continue to decline and is likely suffering. They have decided on comfort care only. We discussed transfer to an inpatient hospice facility and they would like for pt to go to a facility in [place] where they live. CSW assisted in finding bed at [facility].

## 2014-09-28 NOTE — Care Management (Signed)
Admitted from [facility] with the diagnosis of sepsis. [facility] since 09[facility]. Daughter is Delice Bisonara [phone]). Pallative care consult in progress. Family meeting with Pallative today. Gwenette GreetBrenda S Holland RN MSN Care Management [phone]

## 2014-09-28 NOTE — Progress Notes (Signed)
Appreciate palliative care input- patient will be discharged to hospice.

## 2014-09-28 NOTE — Discharge Instructions (Signed)
[facility]  Diet- Puree diet as tolerated. Oxygen- if needed. Currently none.

## 2014-09-29 LAB — URINE CULTURE: Culture: >=100000

## 2014-09-29 LAB — TYPE AND SCREEN
ABO/RH(D): B POS
Antibody Screen: NEGATIVE
Unit division: 0
Unit division: 0
Unit division: 0
Unit division: 0

## 2014-09-29 NOTE — Progress Notes (Addendum)
Pt for discharge to hospice home . Ems here at present time to pick him up.morphine given earlier  For  Transport.  Dressing changed to bottom/ sacral area. Cleansed with  saf clens and  Wet to dry saline dressings.  Dressing change to  Rt arm picc.  Intact and  Clean.  Changed to transport pack per nt's. tol dressing change well.bil colos. Bags intact.  Stomas intact. bil lower ext boot on.

## 2014-09-29 NOTE — Progress Notes (Signed)
Chaplain met with patient provided prayer and spiritual presence. Loralyn Freshwater D. Alroy Dust Wednesday 09-28-2014 [phone]   09/28/14 1800  Clinical Encounter Type  Visited With Patient  Visit Type Initial;Spiritual support  Referral From Physician  Consult/Referral To Chaplain  Spiritual Encounters  Spiritual Needs Prayer  Stress Factors  Patient Stress Factors Health changes  Family Stress Factors None identified

## 2014-09-29 NOTE — Plan of Care (Signed)
Problem: Discharge Progression Outcomes Goal: Discharge plan in place and appropriate Individualization of care  1. Has history of quadriplegia, suprapubic catheter, infected decubitus ulcer, anemia, BPH, CVA, hypertension, diabetes, hyperlipidemia, pancreatitis, thrombocytopenia and DVT. 2. Lives at a skilled nursing facility. 3. A quadriplegic, on low falls. Goal: Other Discharge Outcomes/Goals Plan of care progress to goal LKG:MWNUUVfor:sepsis - On comfort care, resting comfortable, no complaints of pain. - Possible d/c to hospice today, will continue to monitor.

## 2014-09-29 NOTE — Clinical Social Work Note (Signed)
Clinical Social Worker spoke with pt's daughter and updated her. CSW updated referral for [facility], however [facility]. CSW updated Hospital Liaison, Dayna BarkerKaren Robertson, with [facility],. CSW also closed [facility]. Packet is ready for discharge. CSW will continue to follow.   Dede QuerySarah Avraham Benish, MSW, LCSW Clinical Social Worker  [phone]
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
## 2014-09-29 NOTE — Clinical Social Work Note (Signed)
Pt is ready for [facility]. [facility]. Pt's daughter, Delice Bisonara, is aware. CSW is signing off as no further needs identified.   Dede QuerySarah Johnie Stadel, MSW, LCSW Clinical Social Worker  [phone]

## 2014-09-29 NOTE — Progress Notes (Signed)
Dr Nemiah Commanderkalisetti notified of  Lab result. Multi drug resistance from blood culture / pseudomonias

## 2014-09-30 LAB — WOUND CULTURE: Special Requests: NORMAL

## 2014-10-01 LAB — ANAEROBIC CULTURE: Special Requests: NORMAL

## 2014-10-01 LAB — CULTURE, BLOOD (ROUTINE X 2)
Culture: NO GROWTH
Culture: NO GROWTH

## 2014-10-03 LAB — CULTURE, BLOOD (ROUTINE X 2)

## 2014-10-28 DEATH — deceased

## 2016-05-23 IMAGING — CR DG CHEST 1V PORT
1 series · 2 of 2 positions shown · non-contrast
Comparison: None.

CLINICAL DATA: Fever of 101.  Lethargy.

EXAM:
PORTABLE CHEST - 1 VIEW

[Series 1: ap · 0.17mm/px · 2 of 2 slices shown]
[im 1/2]
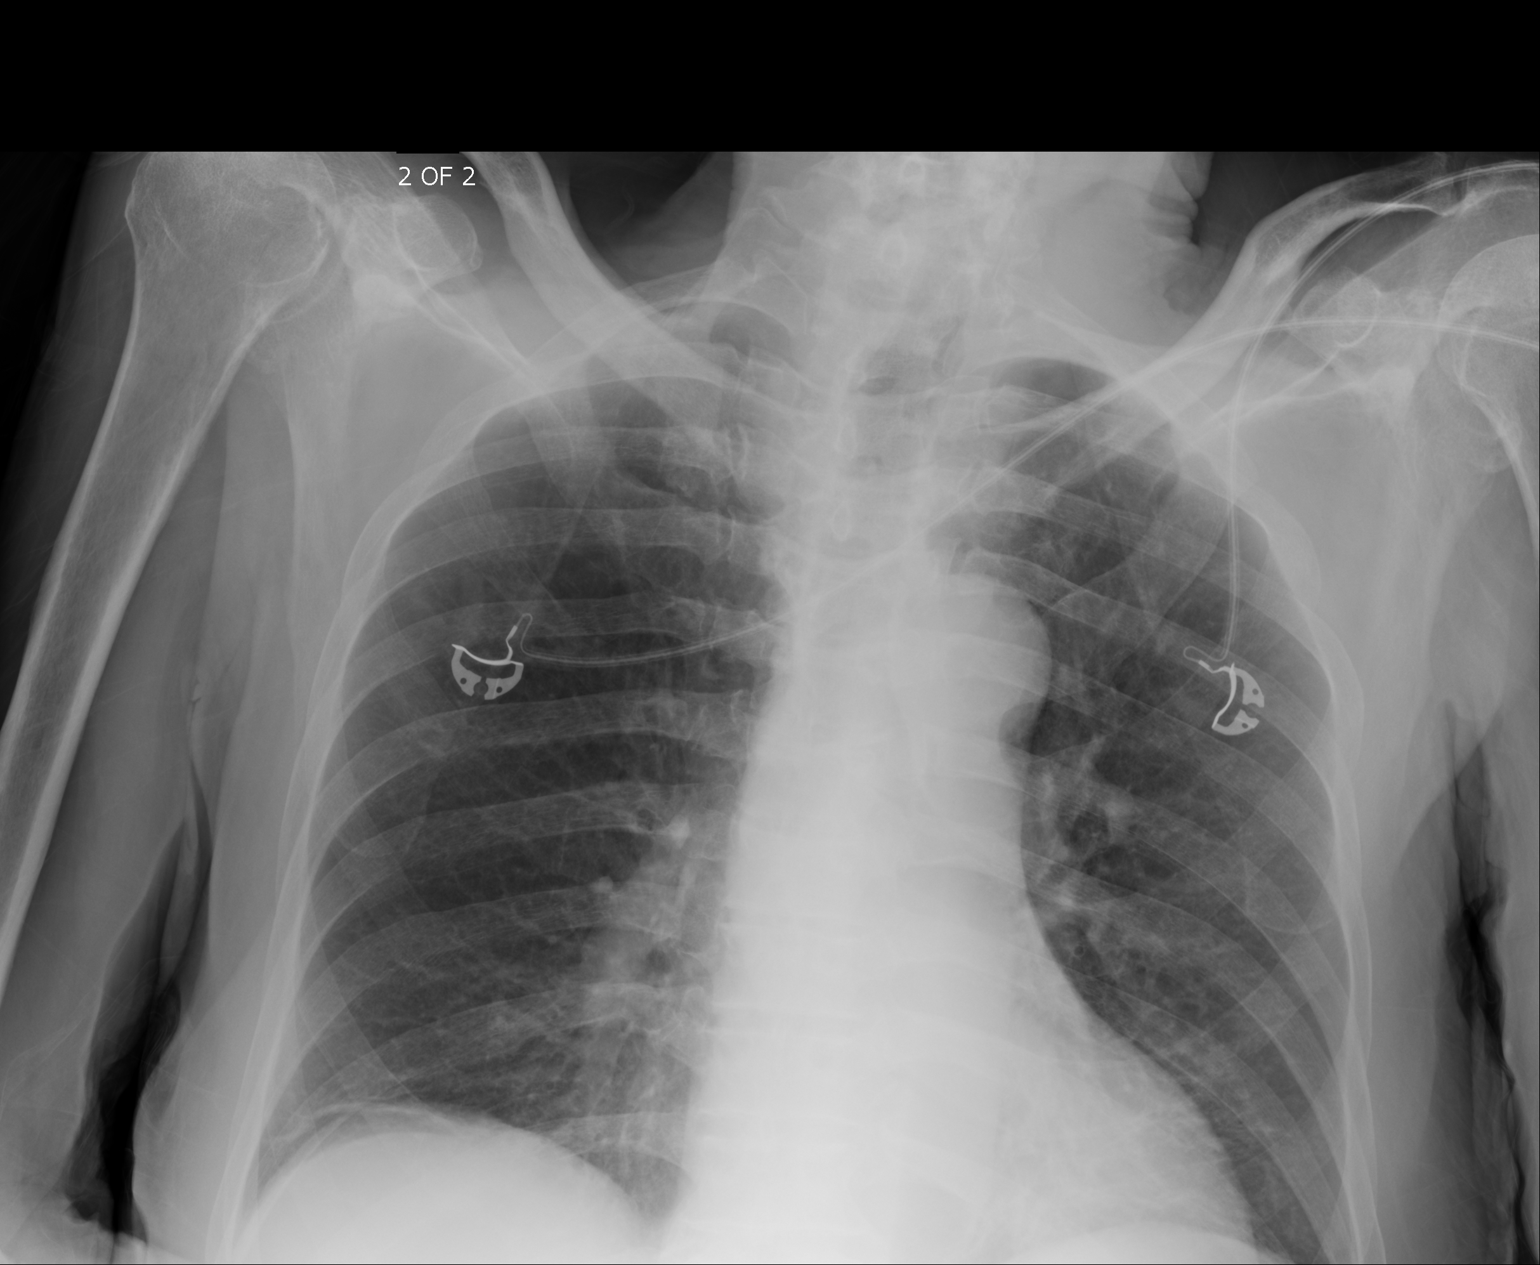
[im 2/2]
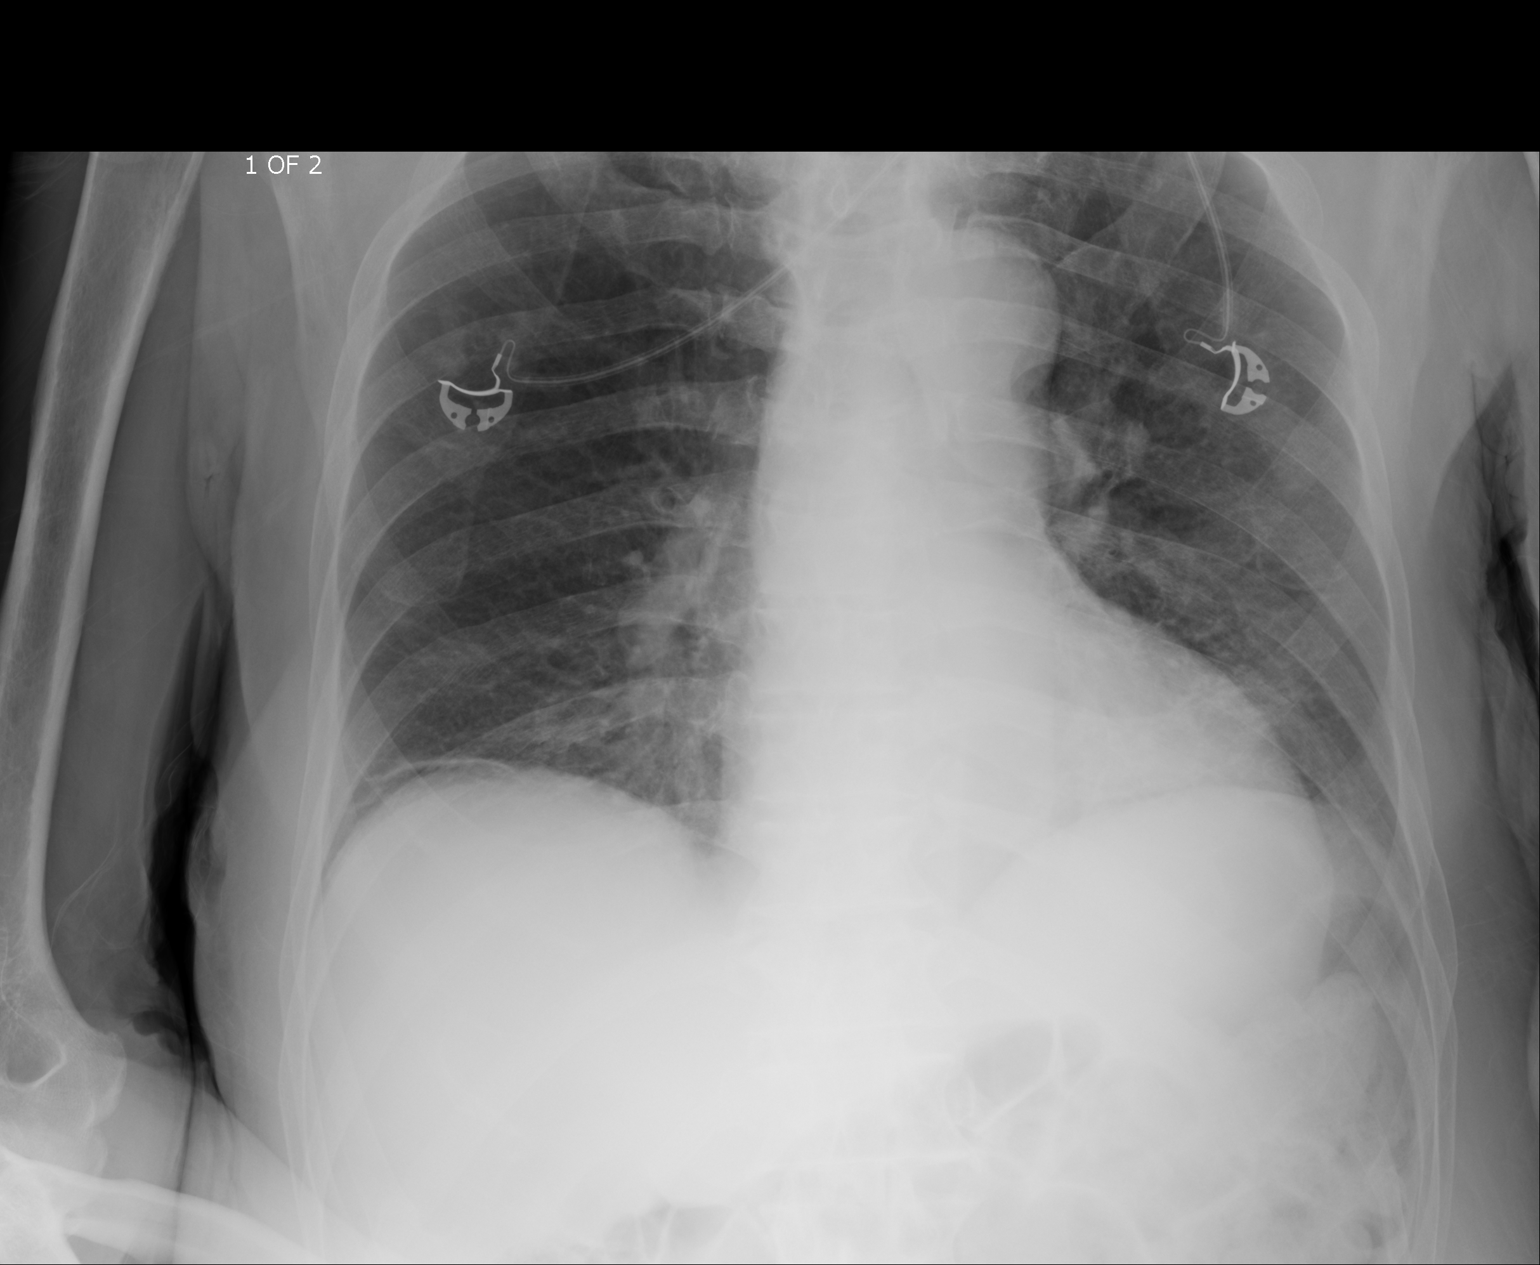

[2 of 2 positions shown; findings below may reference images not displayed]

FINDINGS: Linear band of airspace disease at the right lung base likely
reflecting atelectasis versus scarring. Left lower lobe retrocardiac
airspace disease which may reflect atelectasis versus pneumonia. No
pleural effusion or pneumothorax. The heart and mediastinal contours
are unremarkable.

The osseous structures are unremarkable.
IMPRESSION: 1. Left lower lobe hazy retrocardiac airspace disease which may
reflect atelectasis versus pneumonia.

## 2016-05-28 IMAGING — CR DG CHEST 1V PORT
1 series · 1 of 1 positions shown · non-contrast
Comparison: Chest radiograph performed 08/20/2014

CLINICAL DATA: PICC placement.  Initial encounter.

EXAM:
PORTABLE CHEST - 1 VIEW

[ap]
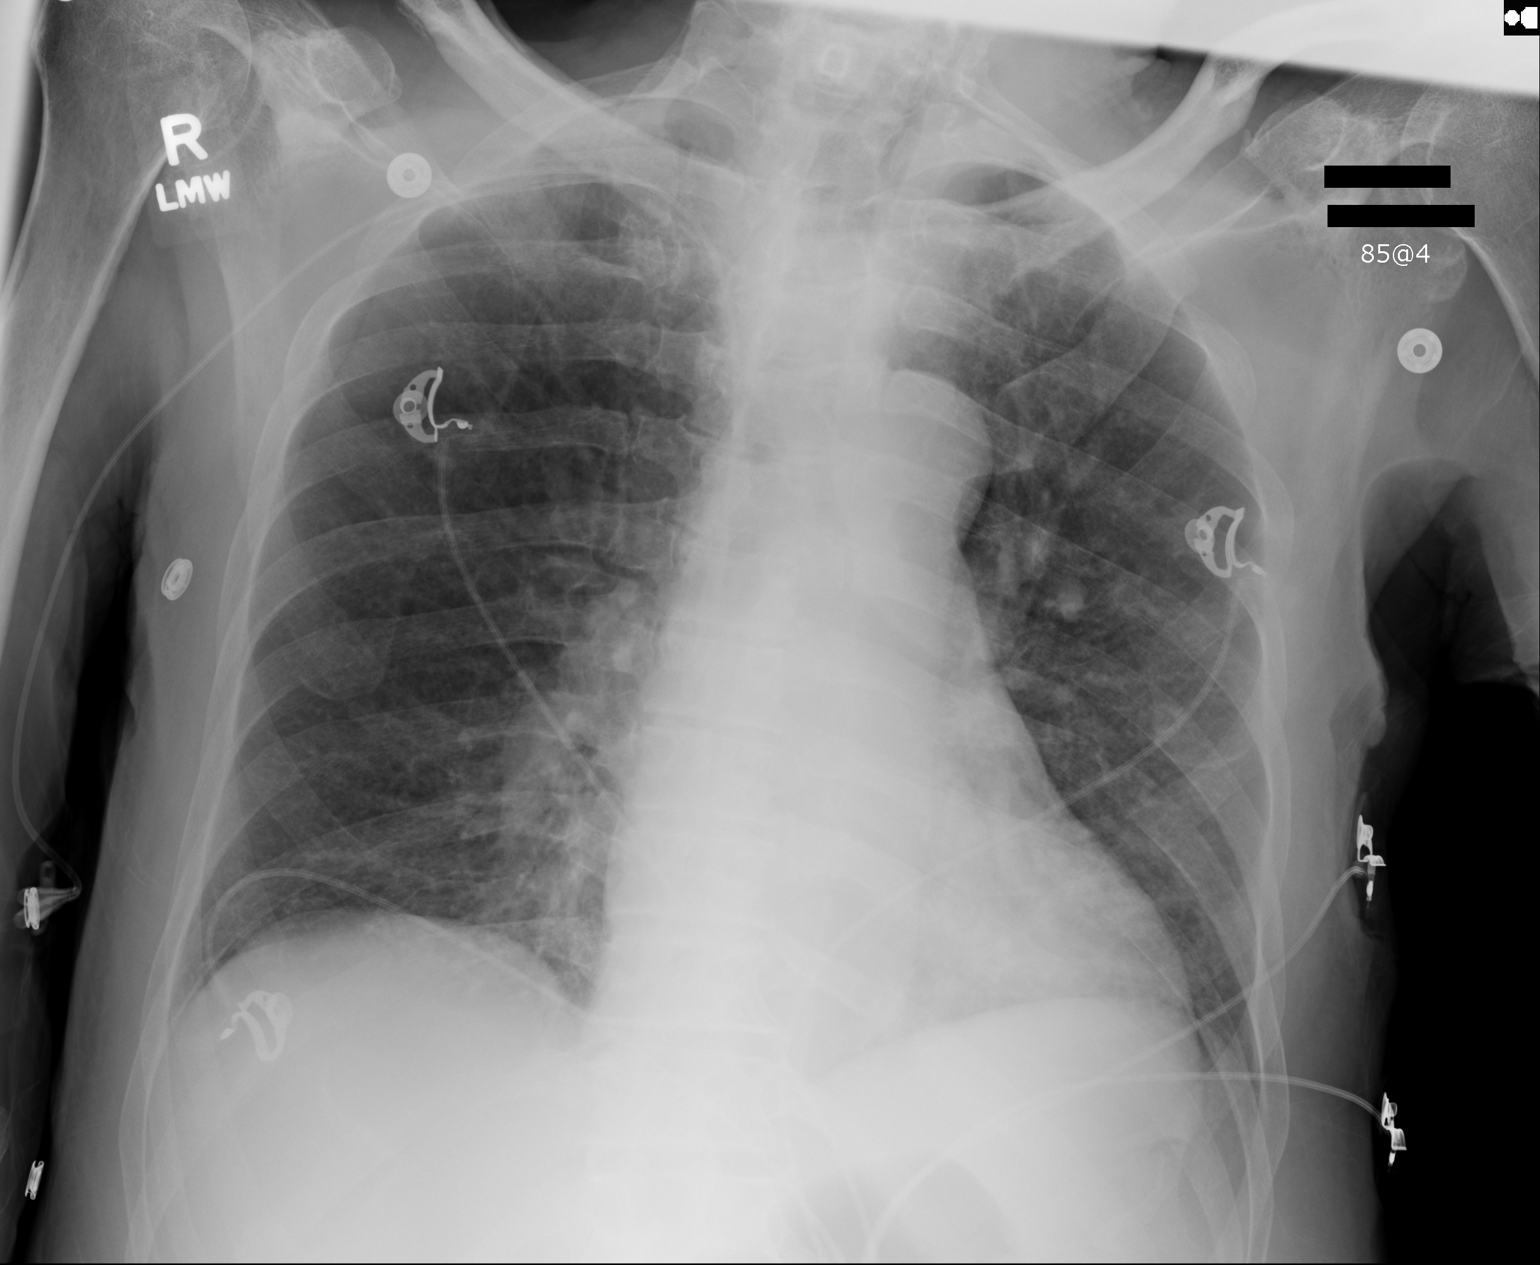

[1 of 1 positions shown; findings below may reference images not displayed]

FINDINGS: The patient's right PICC is noted ending about the distal SVC.

Pulmonary vascularity is at the upper limits of normal. Mild
bibasilar atelectasis is noted. No pleural effusion or pneumothorax
identified.

The cardiomediastinal silhouette is borderline normal in size. No
acute osseous abnormalities are seen.
IMPRESSION: 1. Right PICC noted ending about the distal SVC.
2. Mild bibasilar atelectasis noted.

## 2016-06-10 IMAGING — CR DG CHEST 1V PORT
1 series · 1 of 1 positions shown · non-contrast
Comparison: 08/25/2014

CLINICAL DATA: PICC line placement

EXAM:
PORTABLE CHEST - 1 VIEW

[ap]
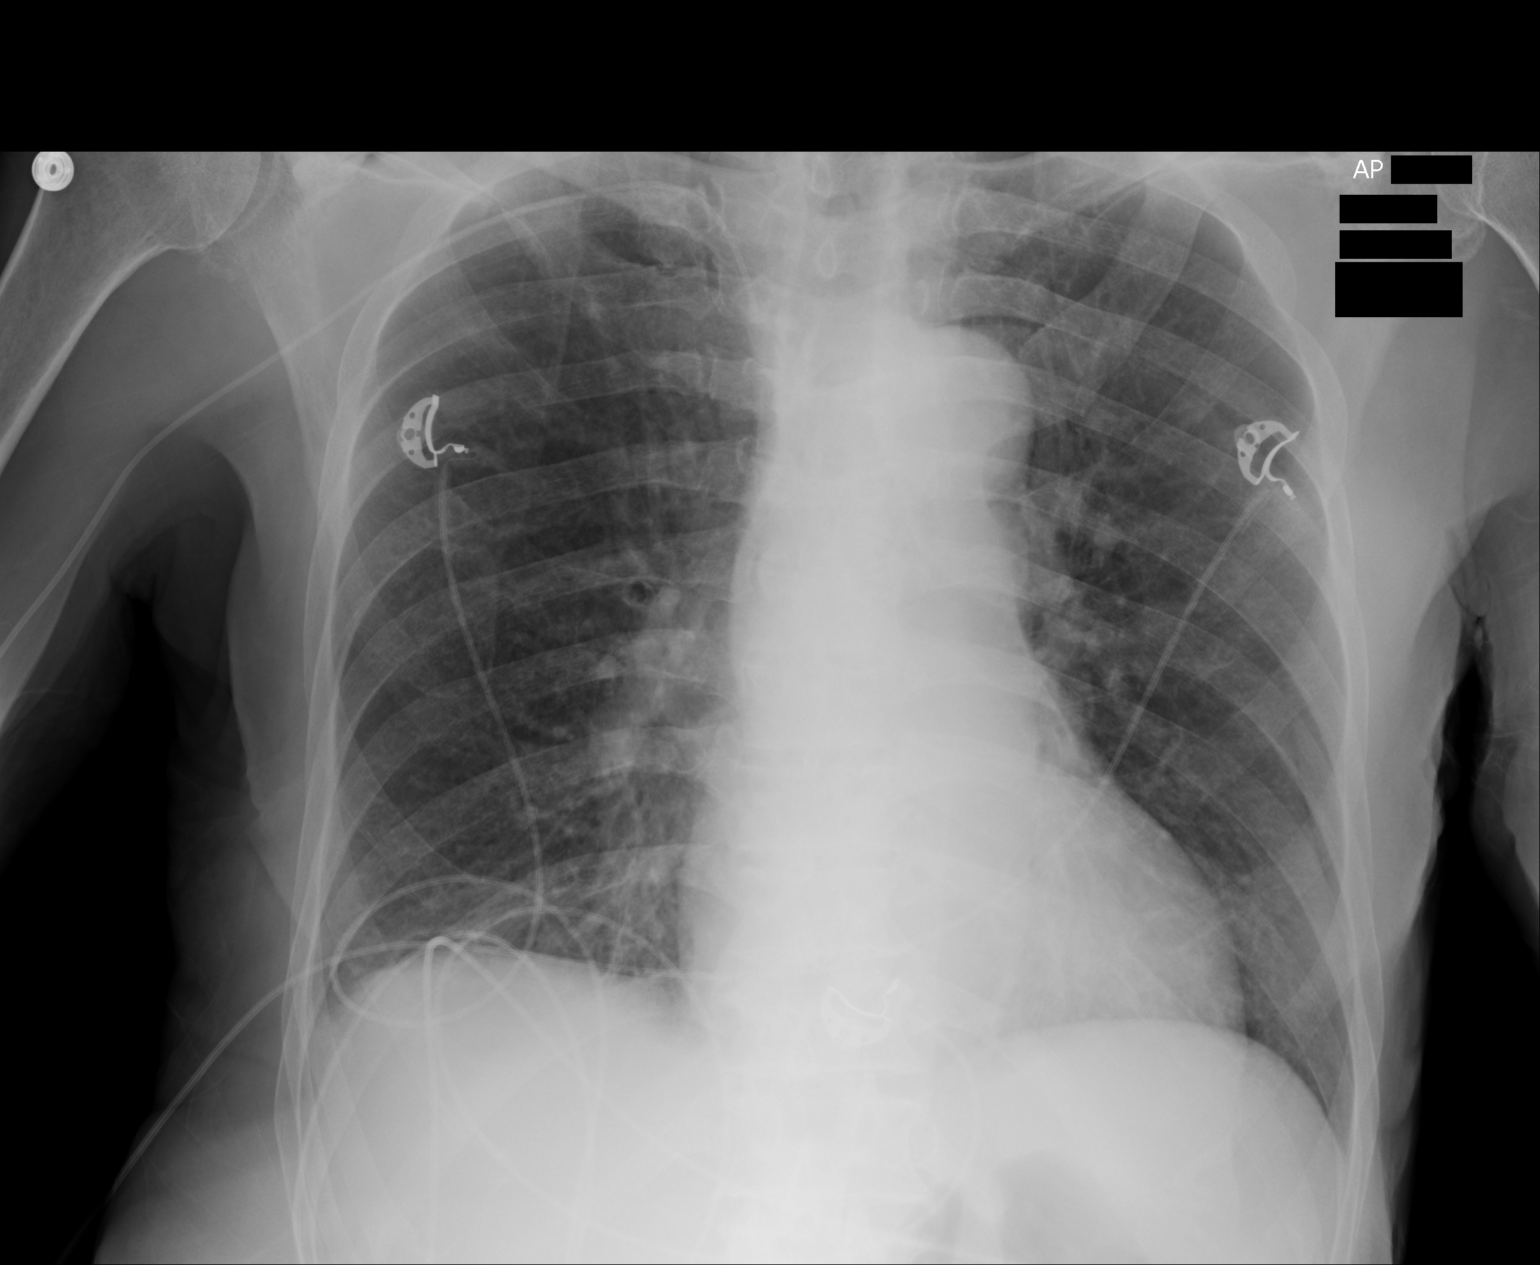

[1 of 1 positions shown; findings below may reference images not displayed]

FINDINGS: The right upper extremity PICC line extends into the upper SVC.

Lungs clear. No large effusions. Pulmonary vasculature is normal.
Hilar, mediastinal and cardiac contours are unremarkable.
IMPRESSION: Right upper extremity PICC line extends to the upper SVC.

## 2016-06-29 IMAGING — CR DG CHEST 1V PORT
1 series · 1 of 1 positions shown · non-contrast
Comparison: 09/07/2014

CLINICAL DATA: Pt reports to ED via EMS. Pt from [REDACTED]. Per EMS pt has hx of Cdiff and VRE. Per EMS pt recently
finished round of antibiotics (Vancomycin). Per EMS pt has stage 4
pressure ulcer on sacrum w/ wound vac. Decreased BP in ER.

EXAM:
PORTABLE CHEST - 1 VIEW

[ap]
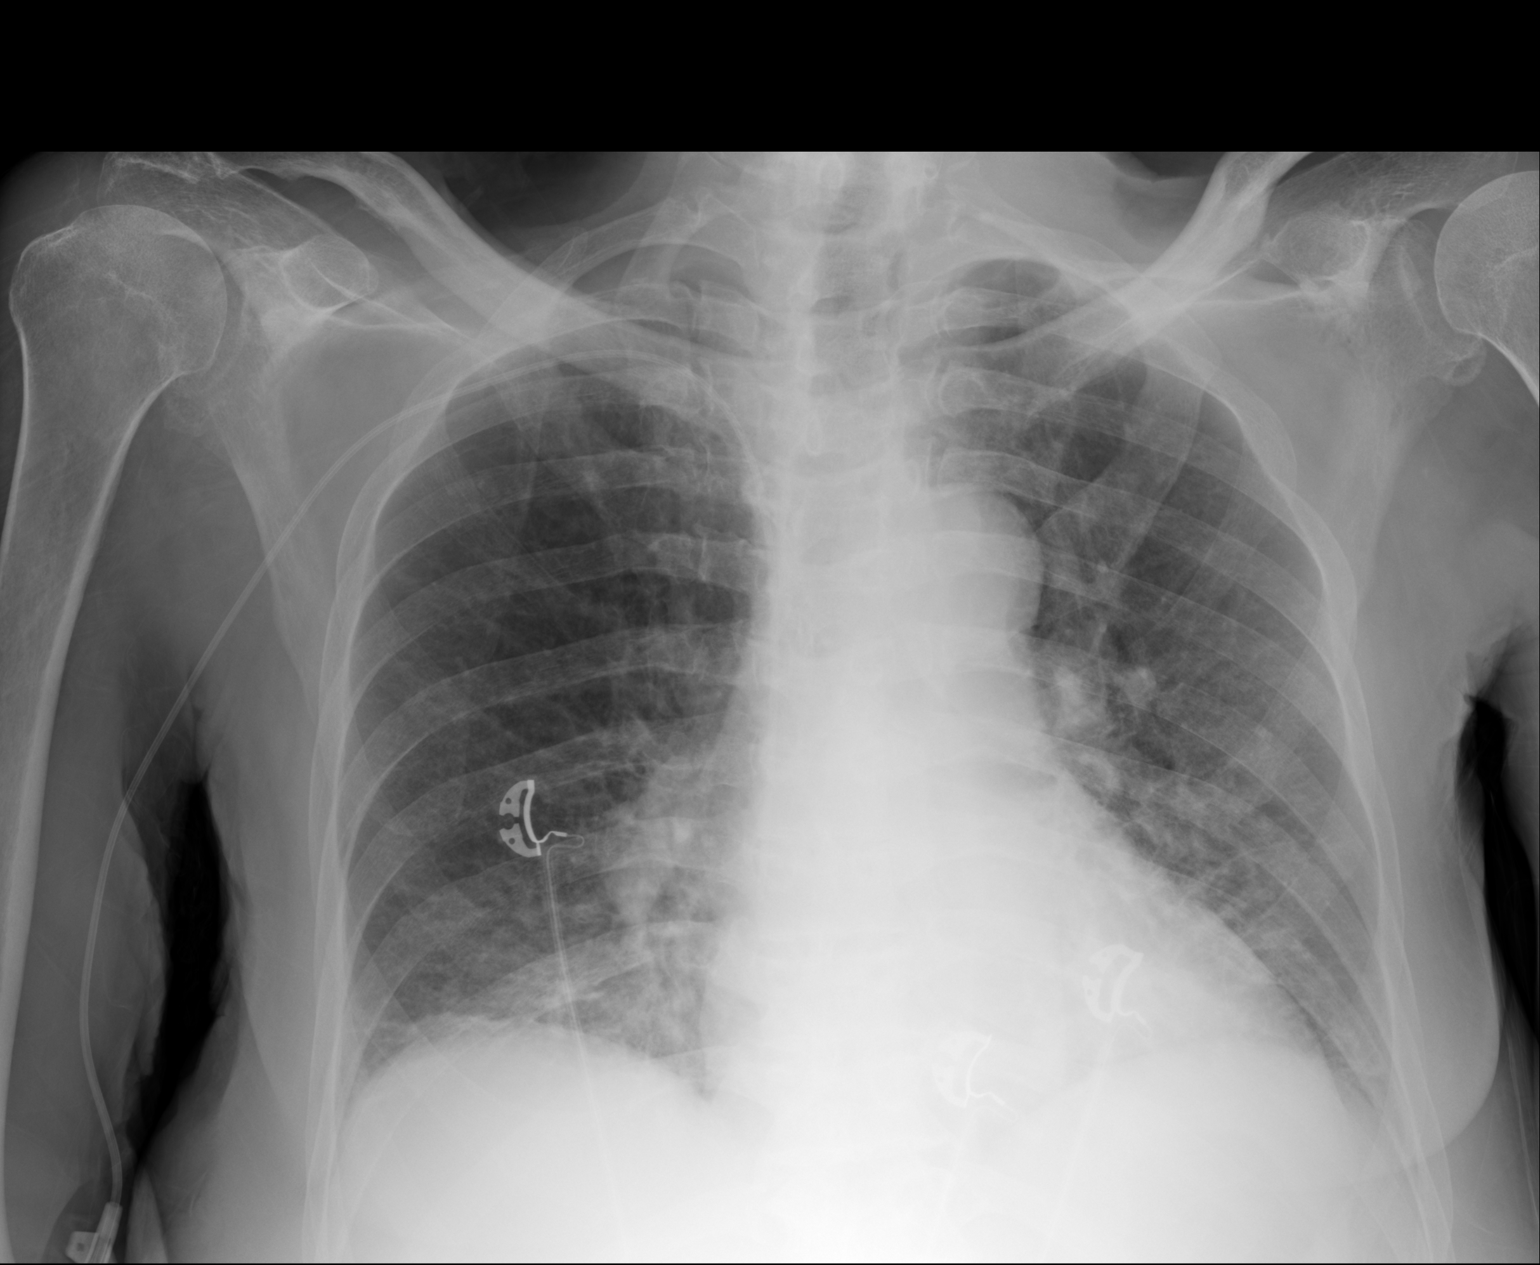

[1 of 1 positions shown; findings below may reference images not displayed]

FINDINGS: There is lung base opacity that has increased from the prior study.
This may all be atelectasis. Pneumonia should be considered if there
are consistent clinical symptoms. No convincing pulmonary edema no
obvious pleural effusion no pneumothorax.

Cardiac silhouette is normal in size.  No mediastinal hilar masses.

Right PICC has its tip projecting in the lower superior vena cava,
well positioned and more distal than on the prior study.
IMPRESSION: 1. Bilateral lung base opacity, increased from previous exam. This
is most likely all atelectasis. A component of pneumonia is
possible.
2. No pulmonary edema.
